# Patient Record
Sex: Male | Born: 1965 | Race: White | Hispanic: No | Marital: Married | Smoking: Never smoker
Health system: Southern US, Academic
[De-identification: ages and names within clinical notes are randomized; demographics above are authoritative.]

## PROBLEM LIST (undated history)

## (undated) DIAGNOSIS — J309 Allergic rhinitis, unspecified: Secondary | ICD-10-CM

## (undated) DIAGNOSIS — G43909 Migraine, unspecified, not intractable, without status migrainosus: Secondary | ICD-10-CM

## (undated) DIAGNOSIS — K219 Gastro-esophageal reflux disease without esophagitis: Secondary | ICD-10-CM

## (undated) DIAGNOSIS — E538 Deficiency of other specified B group vitamins: Secondary | ICD-10-CM

## (undated) DIAGNOSIS — K589 Irritable bowel syndrome without diarrhea: Secondary | ICD-10-CM

## (undated) DIAGNOSIS — E7800 Pure hypercholesterolemia, unspecified: Secondary | ICD-10-CM

## (undated) DIAGNOSIS — F419 Anxiety disorder, unspecified: Secondary | ICD-10-CM

## (undated) DIAGNOSIS — E559 Vitamin D deficiency, unspecified: Secondary | ICD-10-CM

## (undated) DIAGNOSIS — R739 Hyperglycemia, unspecified: Secondary | ICD-10-CM

## (undated) DIAGNOSIS — I10 Essential (primary) hypertension: Secondary | ICD-10-CM

## (undated) DIAGNOSIS — M1990 Unspecified osteoarthritis, unspecified site: Secondary | ICD-10-CM

## (undated) DIAGNOSIS — M109 Gout, unspecified: Secondary | ICD-10-CM

## (undated) DIAGNOSIS — M5431 Sciatica, right side: Secondary | ICD-10-CM

## (undated) DIAGNOSIS — G4700 Insomnia, unspecified: Secondary | ICD-10-CM

## (undated) DIAGNOSIS — R972 Elevated prostate specific antigen [PSA]: Secondary | ICD-10-CM

## (undated) HISTORY — DX: Gout, unspecified: M10.9

## (undated) HISTORY — DX: Sciatica, right side: M54.31

## (undated) HISTORY — DX: Deficiency of other specified B group vitamins: E53.8

## (undated) HISTORY — PX: COLONOSCOPY: WVUENDOPRO10

## (undated) HISTORY — DX: Insomnia, unspecified: G47.00

## (undated) HISTORY — DX: Elevated prostate specific antigen (PSA): R97.20

## (undated) HISTORY — DX: Hyperglycemia, unspecified: R73.9

## (undated) HISTORY — DX: Migraine, unspecified, not intractable, without status migrainosus: G43.909

## (undated) HISTORY — DX: Irritable bowel syndrome, unspecified: K58.9

## (undated) HISTORY — PX: SPINE SURGERY: SHX786

## (undated) HISTORY — DX: Vitamin D deficiency, unspecified: E55.9

---

## 1996-07-21 ENCOUNTER — Other Ambulatory Visit (HOSPITAL_COMMUNITY): Payer: Self-pay

## 2009-01-09 IMAGING — CR CXR
1 series · 2 of 2 positions shown · non-contrast
Comparison: 

Deberry, Osman

EXAM:
PA AND LATERAL CHEST 2V
HISTORY: Cough, sinus pressure, headache.

[Series 1: view not recorded · 0.17mm/px · 2 of 2 slices shown]
[im 1/2]
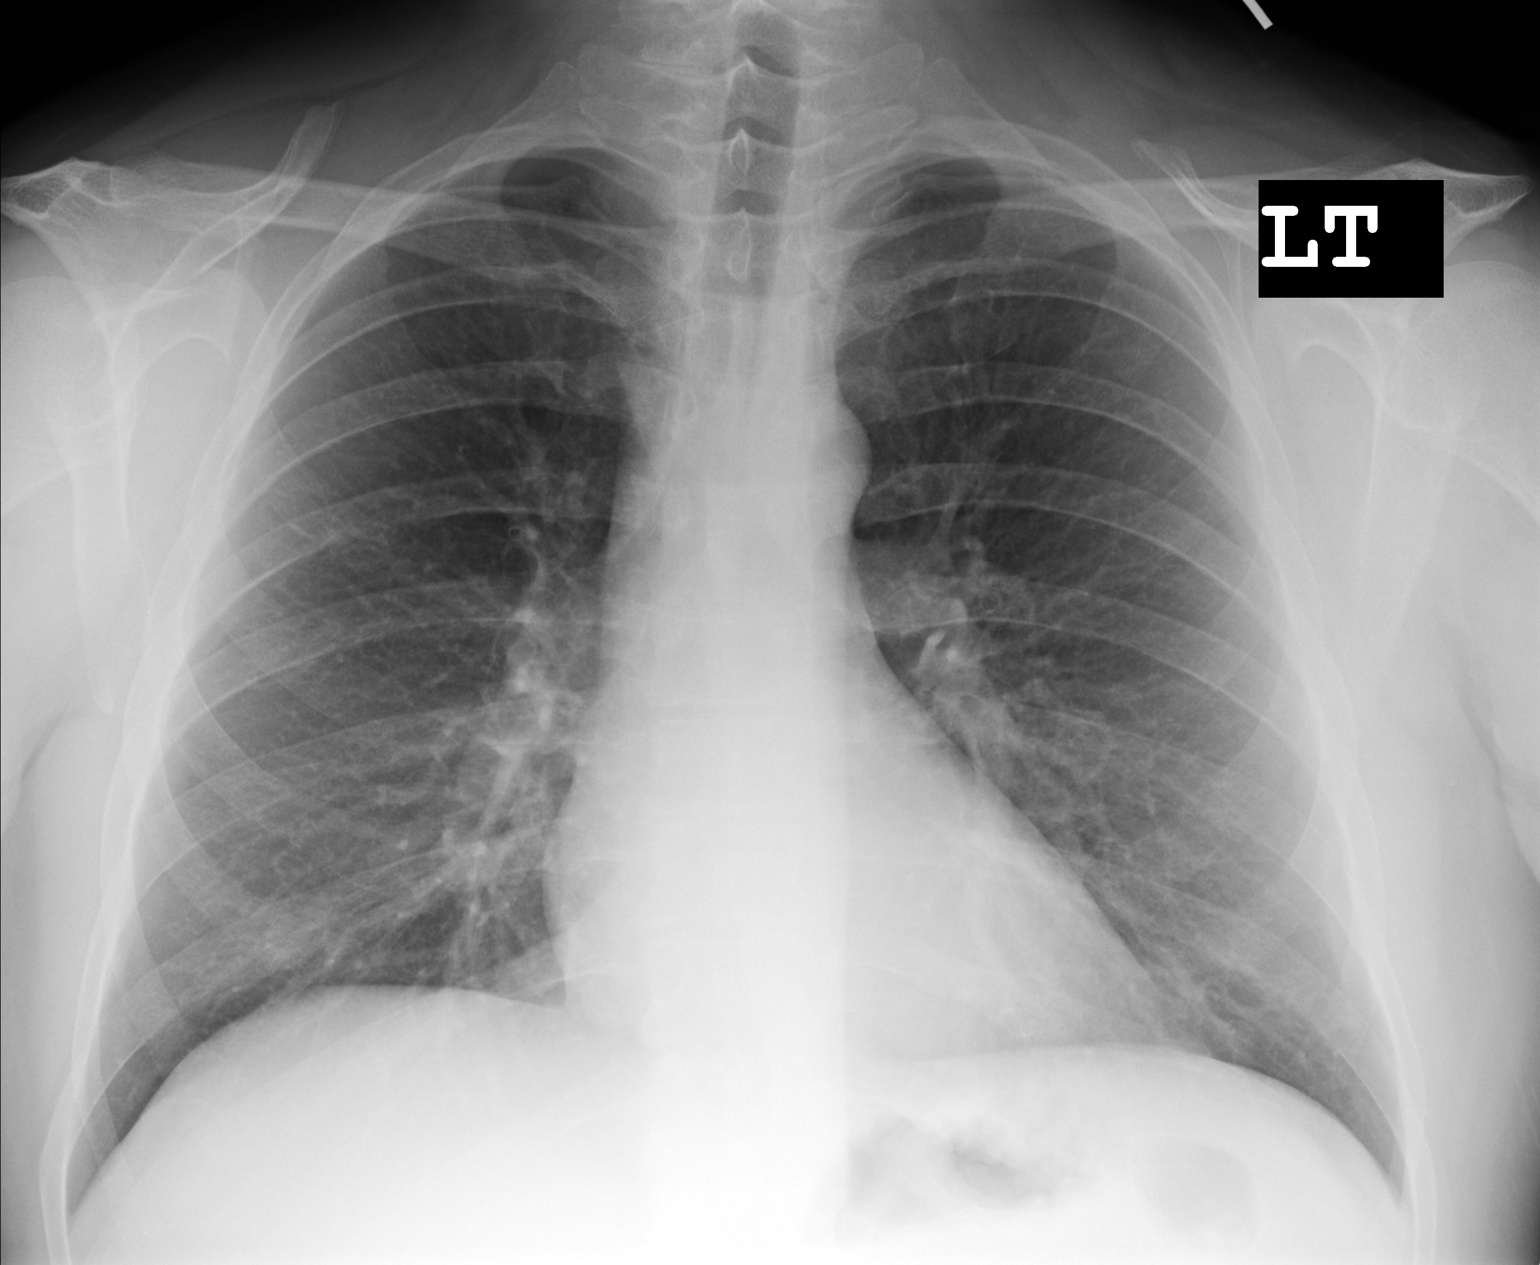
[im 2/2]
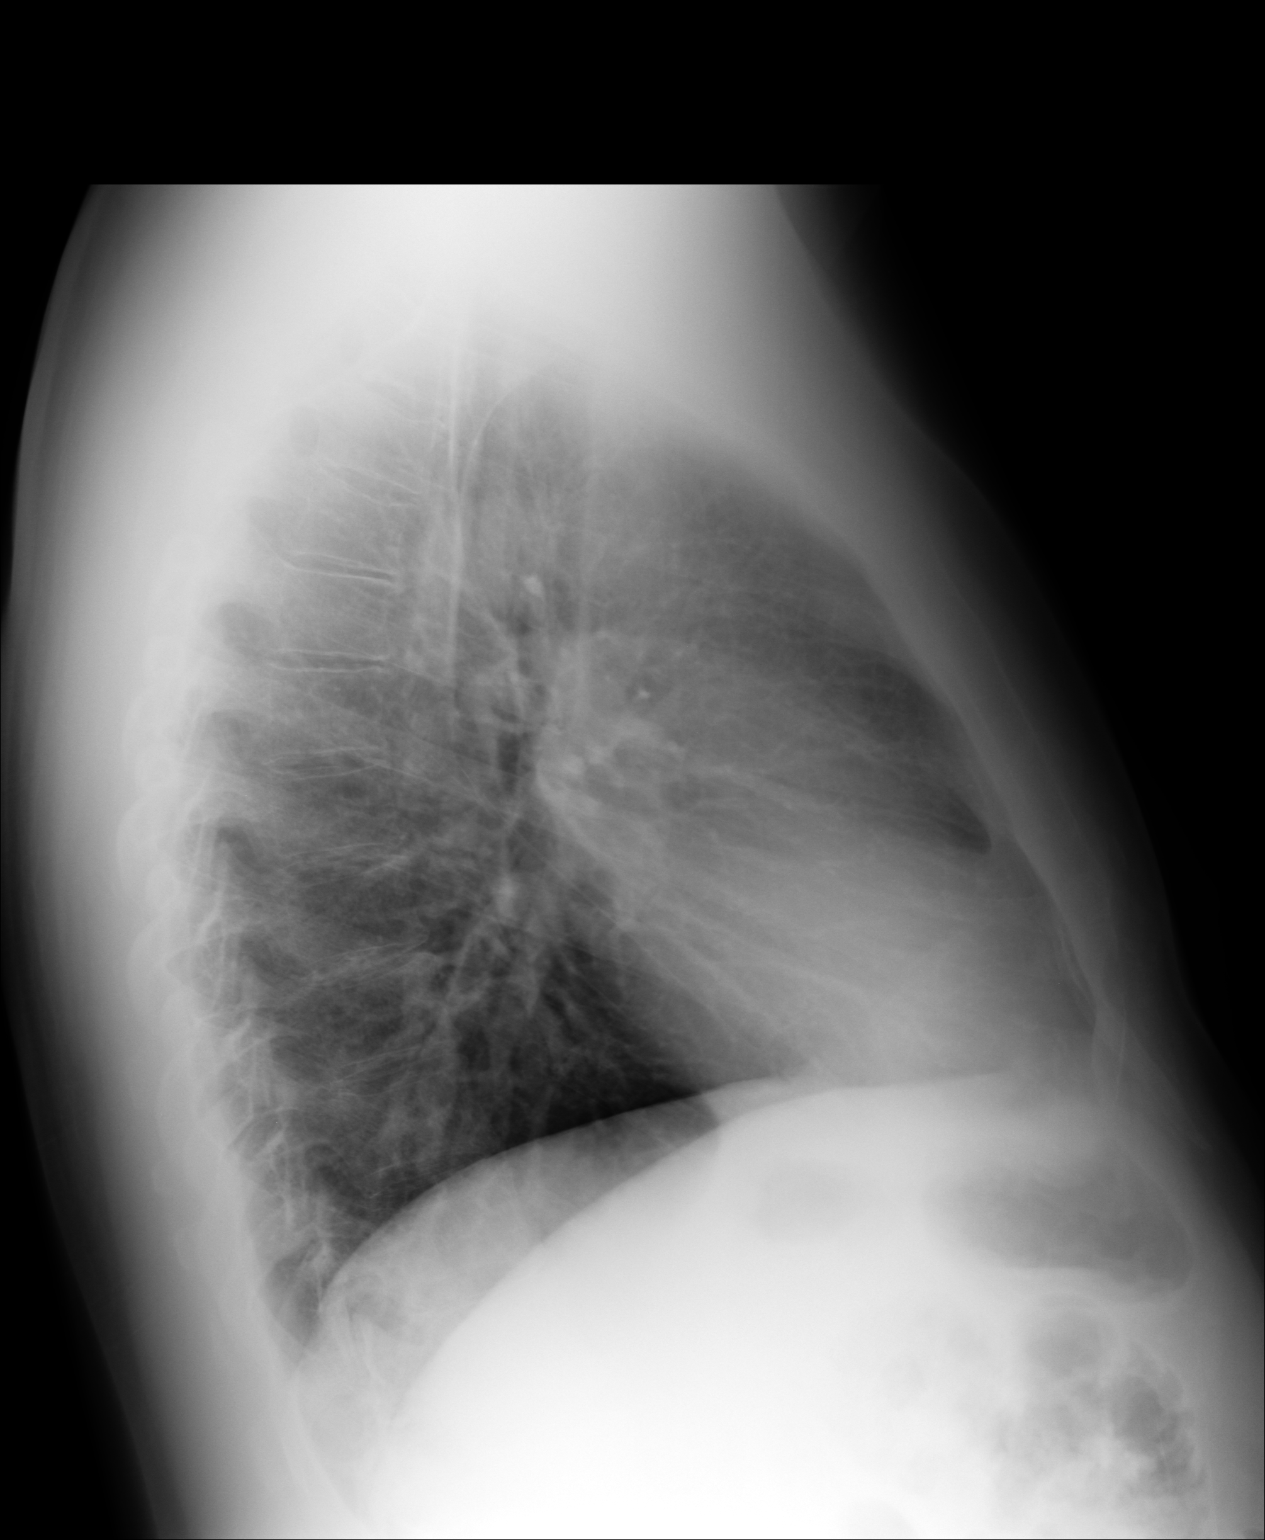

[2 of 2 positions shown; findings below may reference images not displayed]

FINDINGS: The cardiothoracic ratio is within normal limits.  Lung fields are clear.  Hila and mediastinum are normal.
IMPRESSION: No acute cardiopulmonary abnormalities are seen, and no interval changes are seen from previous chest x-rays.  

________________________________

## 2009-01-09 IMAGING — CR SINUSES
1 series · 3 of 3 positions shown · non-contrast
Comparison: none

Ludeen, Nangaily

EXAM:
SINUS SERIES 3V
HISTORY: Cough, sinus pressure, headache.

[Series 1: view not recorded · 0.17mm/px · 3 of 3 slices shown]
[im 1/3]
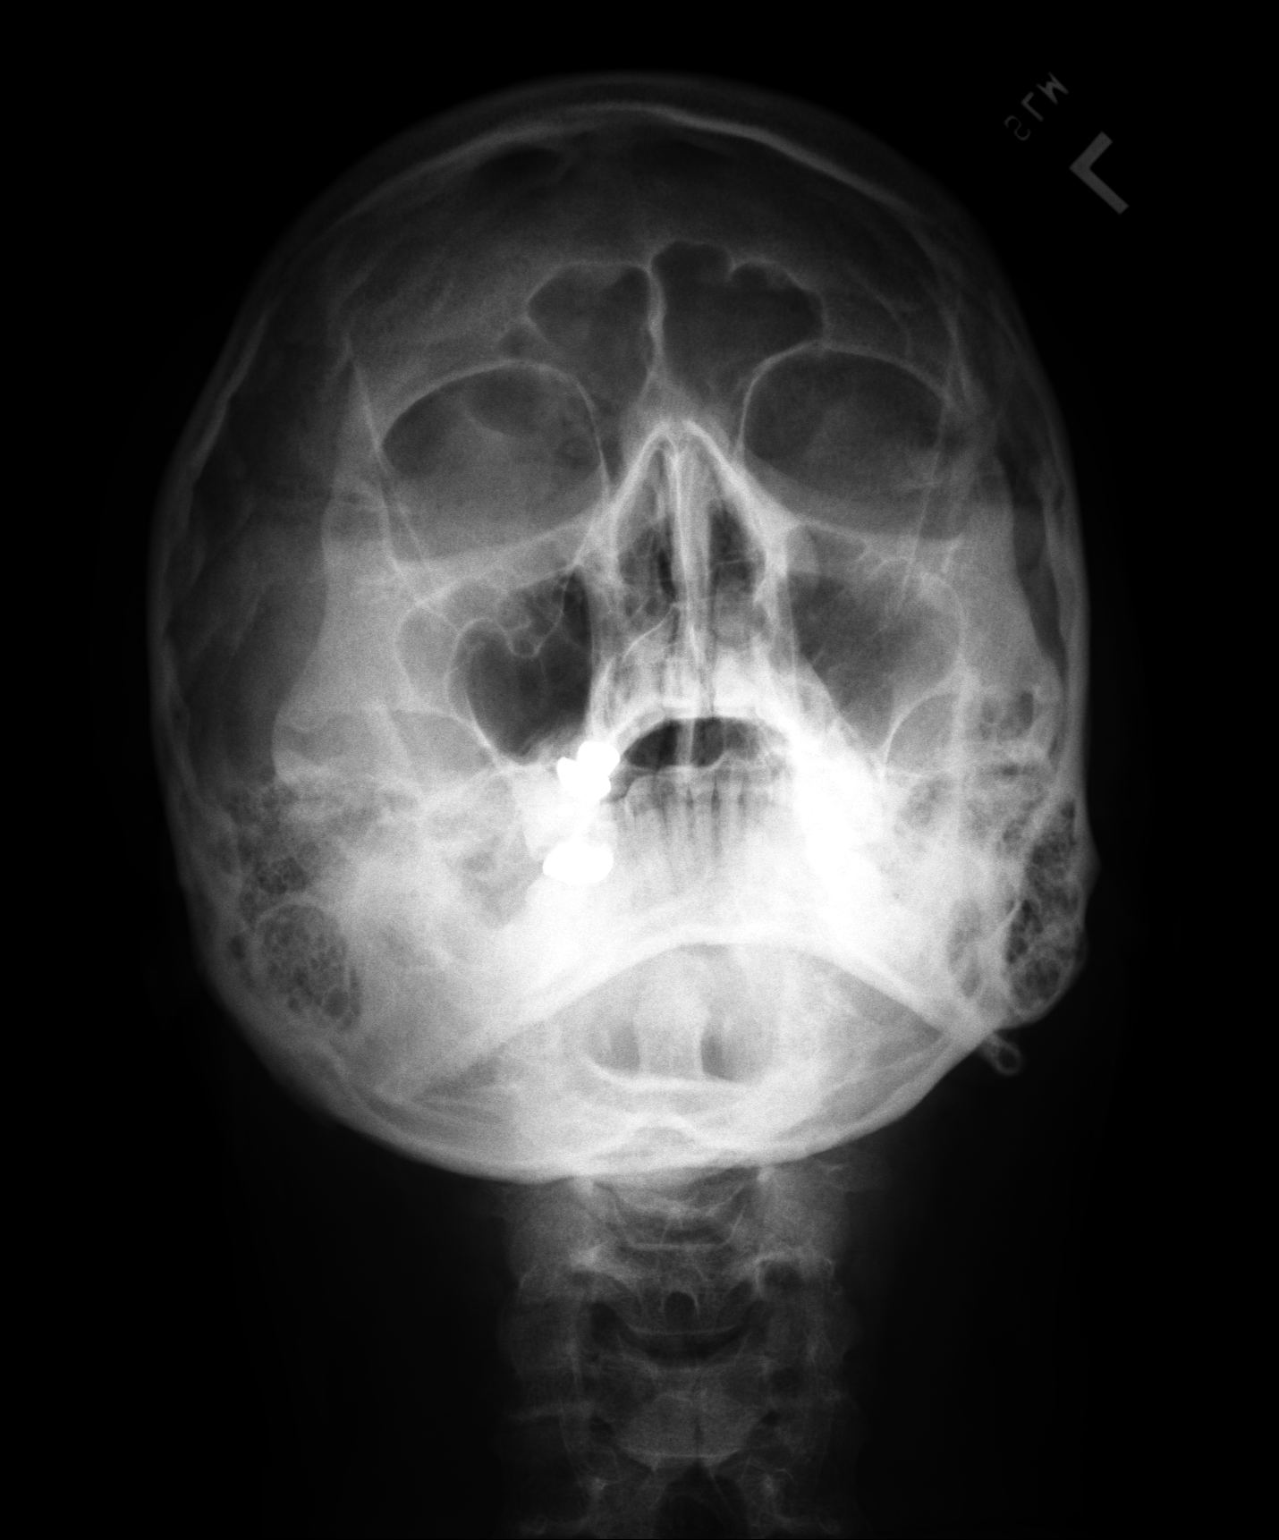
[im 2/3]
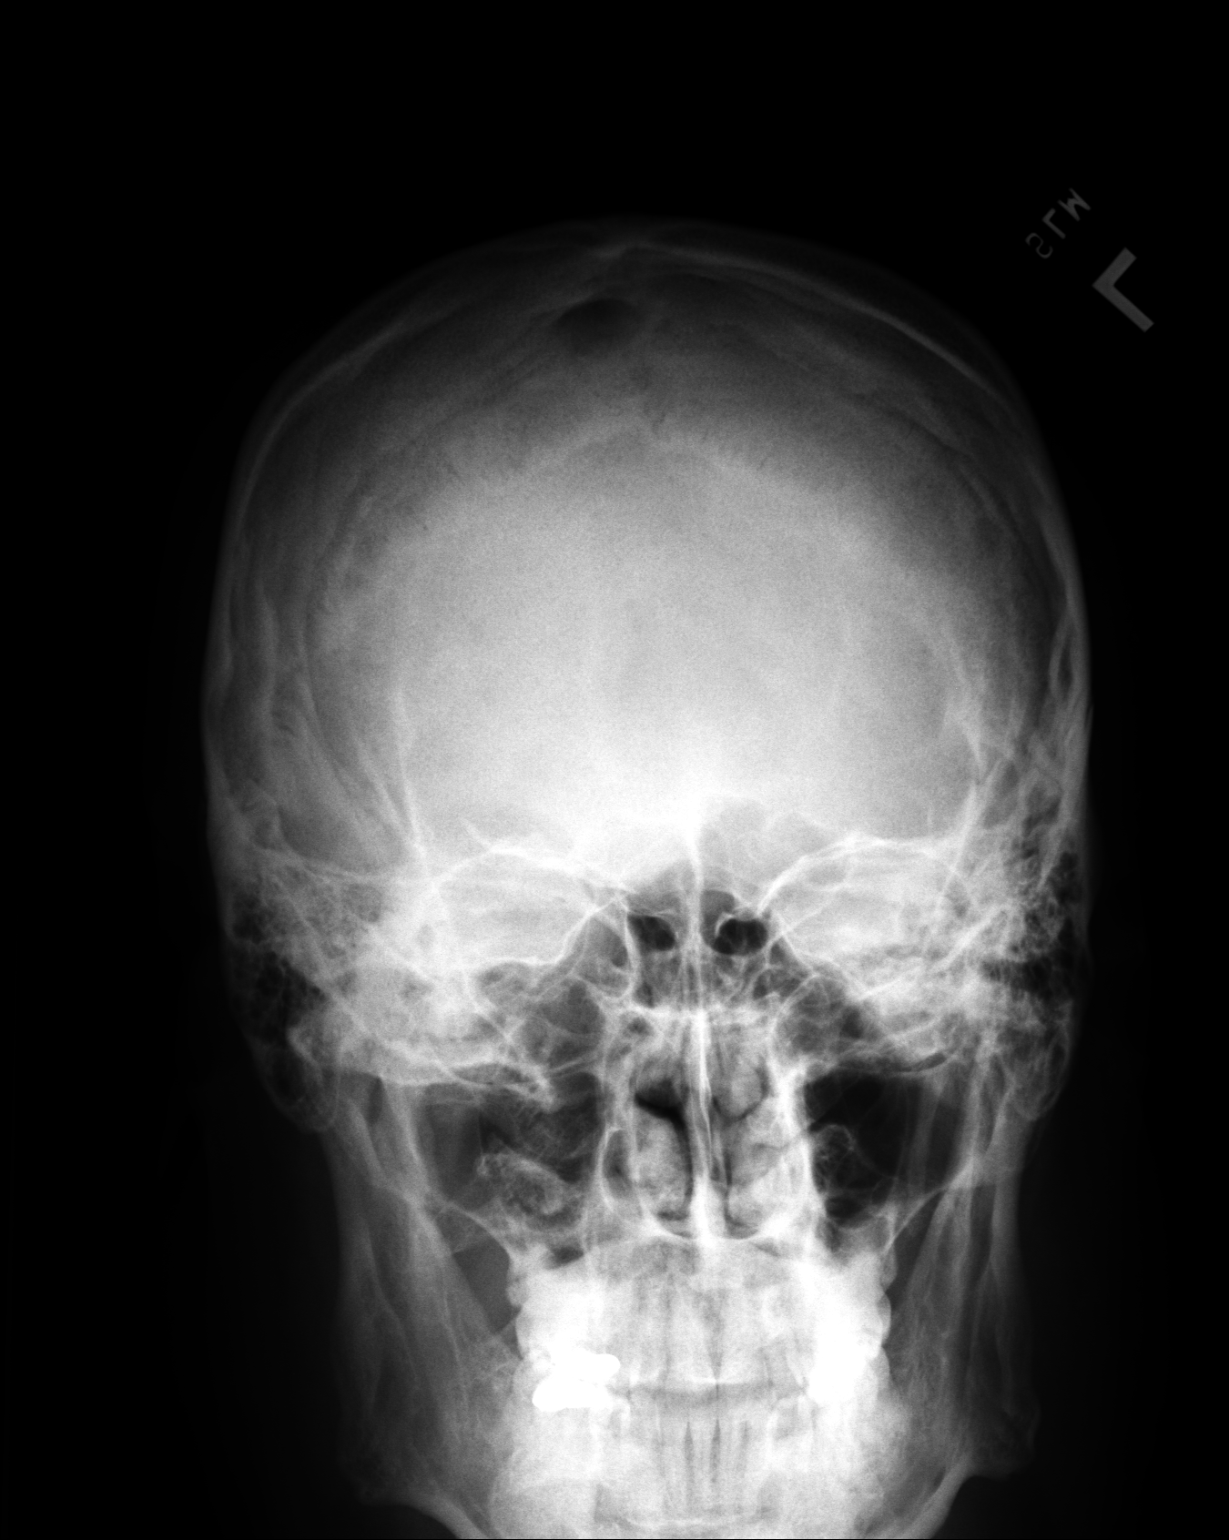
[im 3/3]
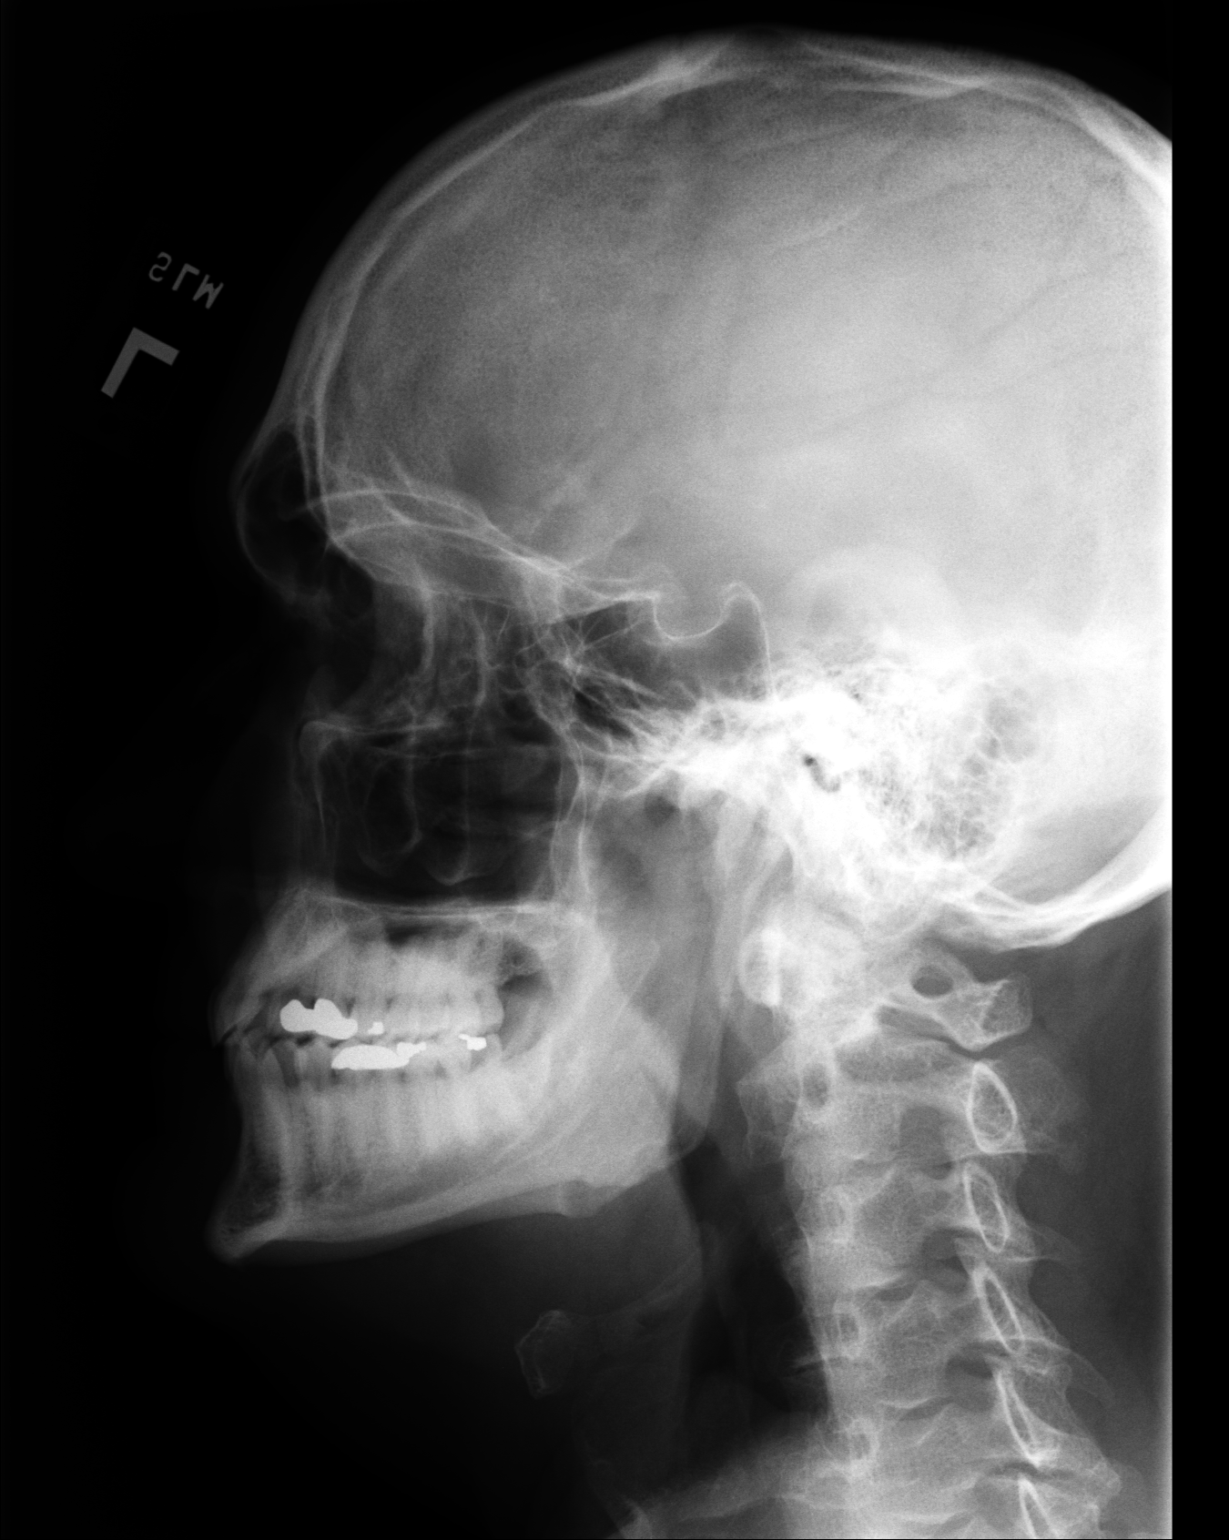

[3 of 3 positions shown; findings below may reference images not displayed]

FINDINGS: Sinuses are clear.  No inflammatory changes or fluid collections are seen.  No focal bone changes are seen.
IMPRESSION: No acute abnormalities of the sinuses are seen.    

________________________________

## 2010-05-23 IMAGING — CR CHEST
1 series · 3 of 3 positions shown · non-contrast
Comparison: 

Adu, Amrita

Exam:
PA and Lateral Chest 2V
HISTORY: Hypertension, positive tuberculin skin test.

[Series 1: view not recorded · 0.17mm/px · 3 of 3 slices shown]
[im 1/3]
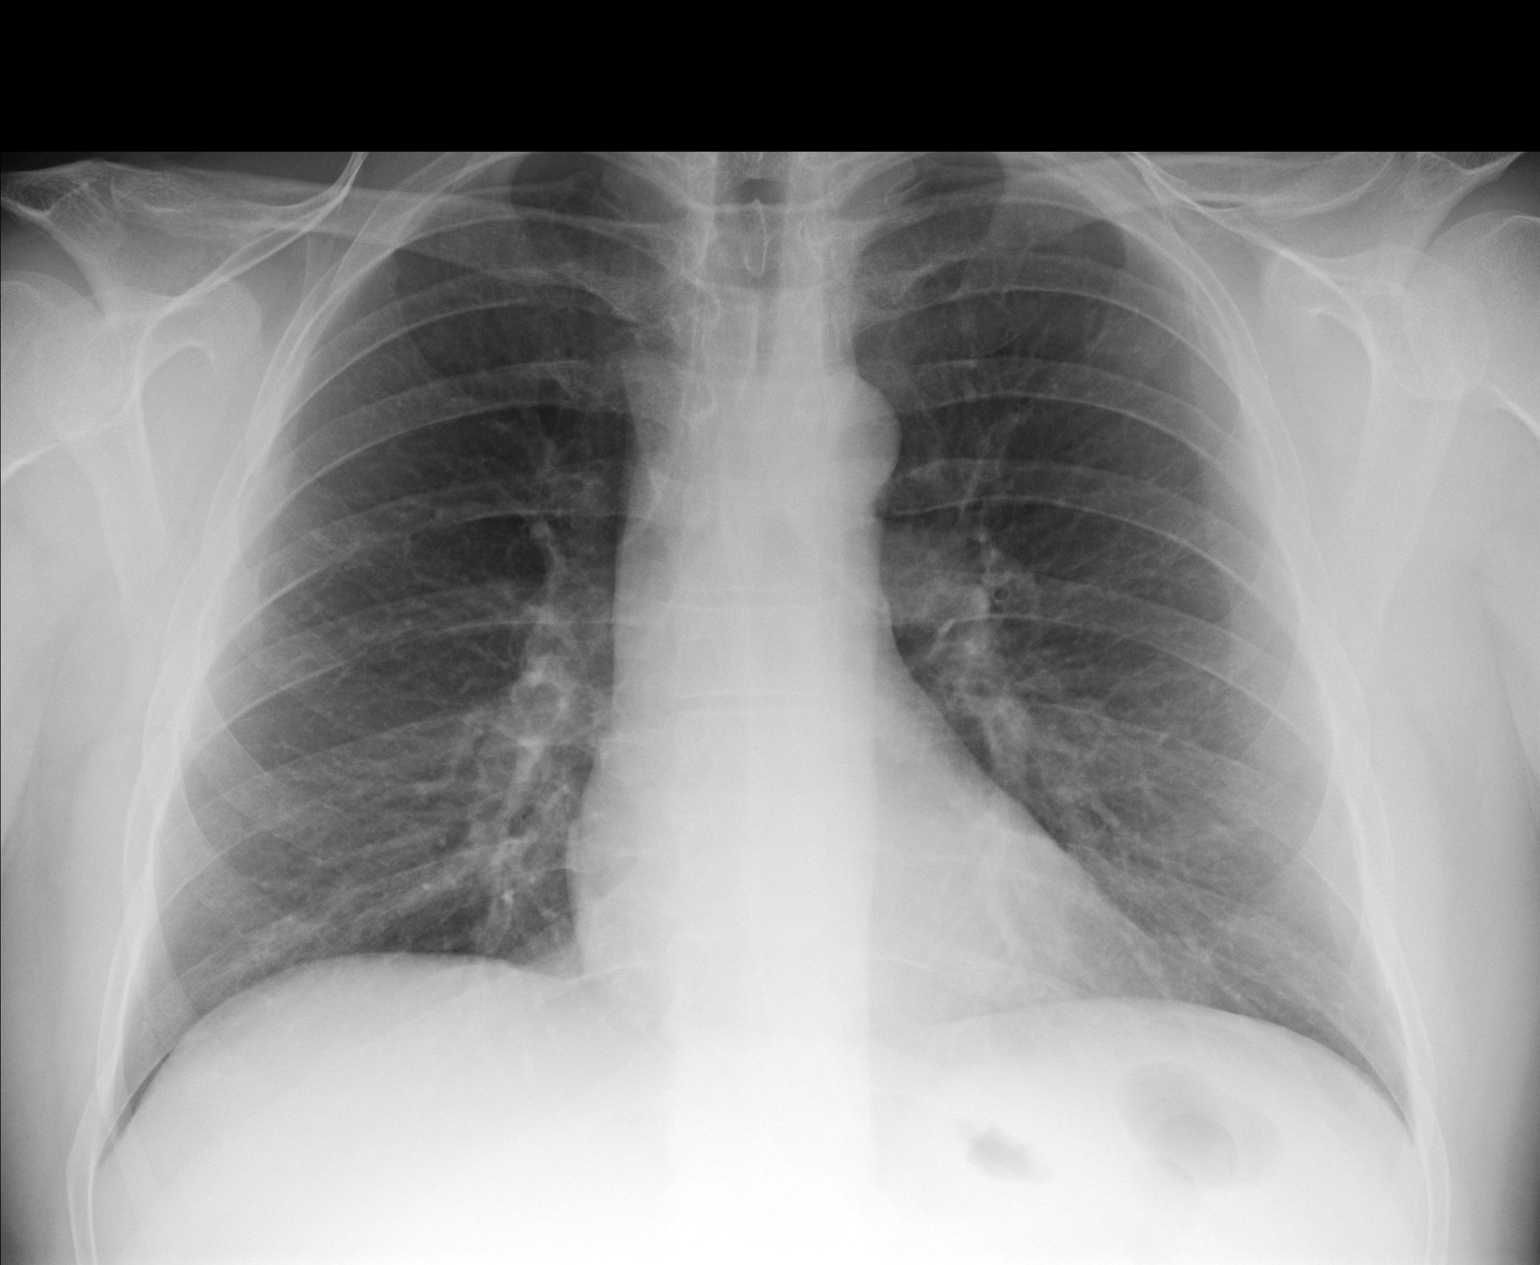
[im 2/3]
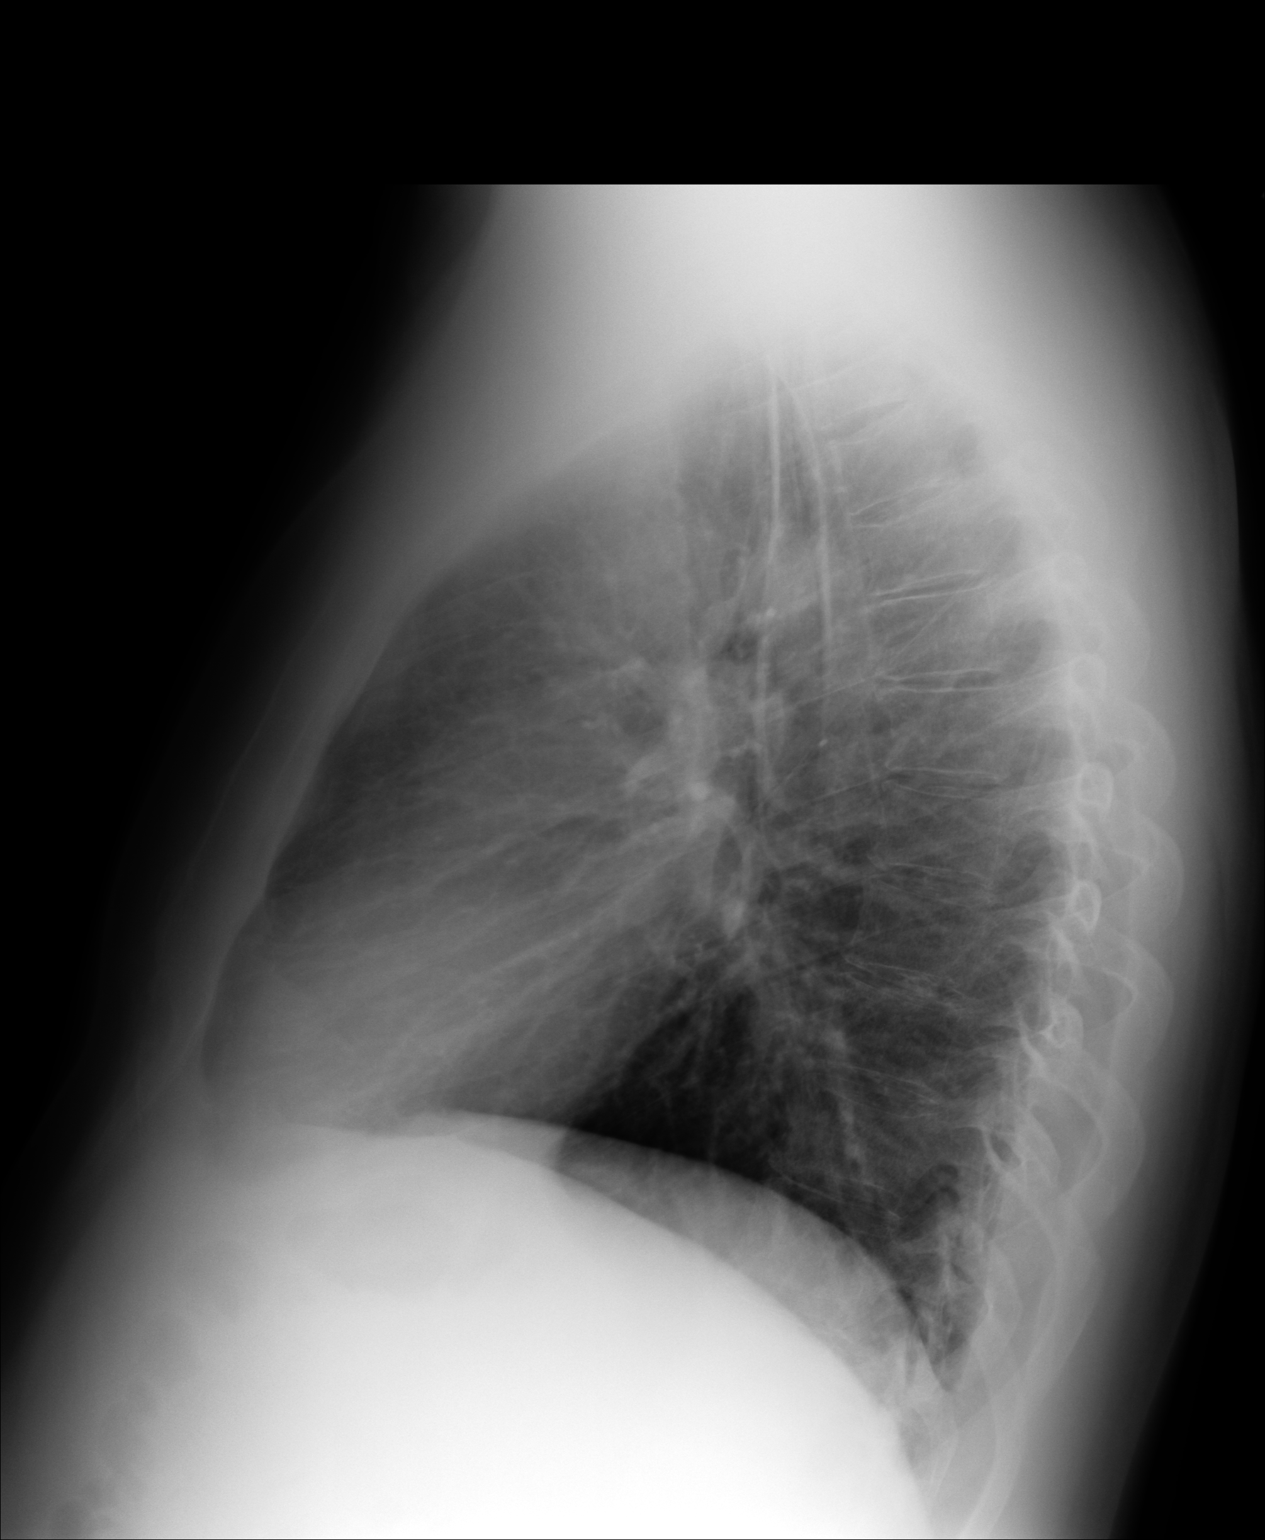
[im 3/3]
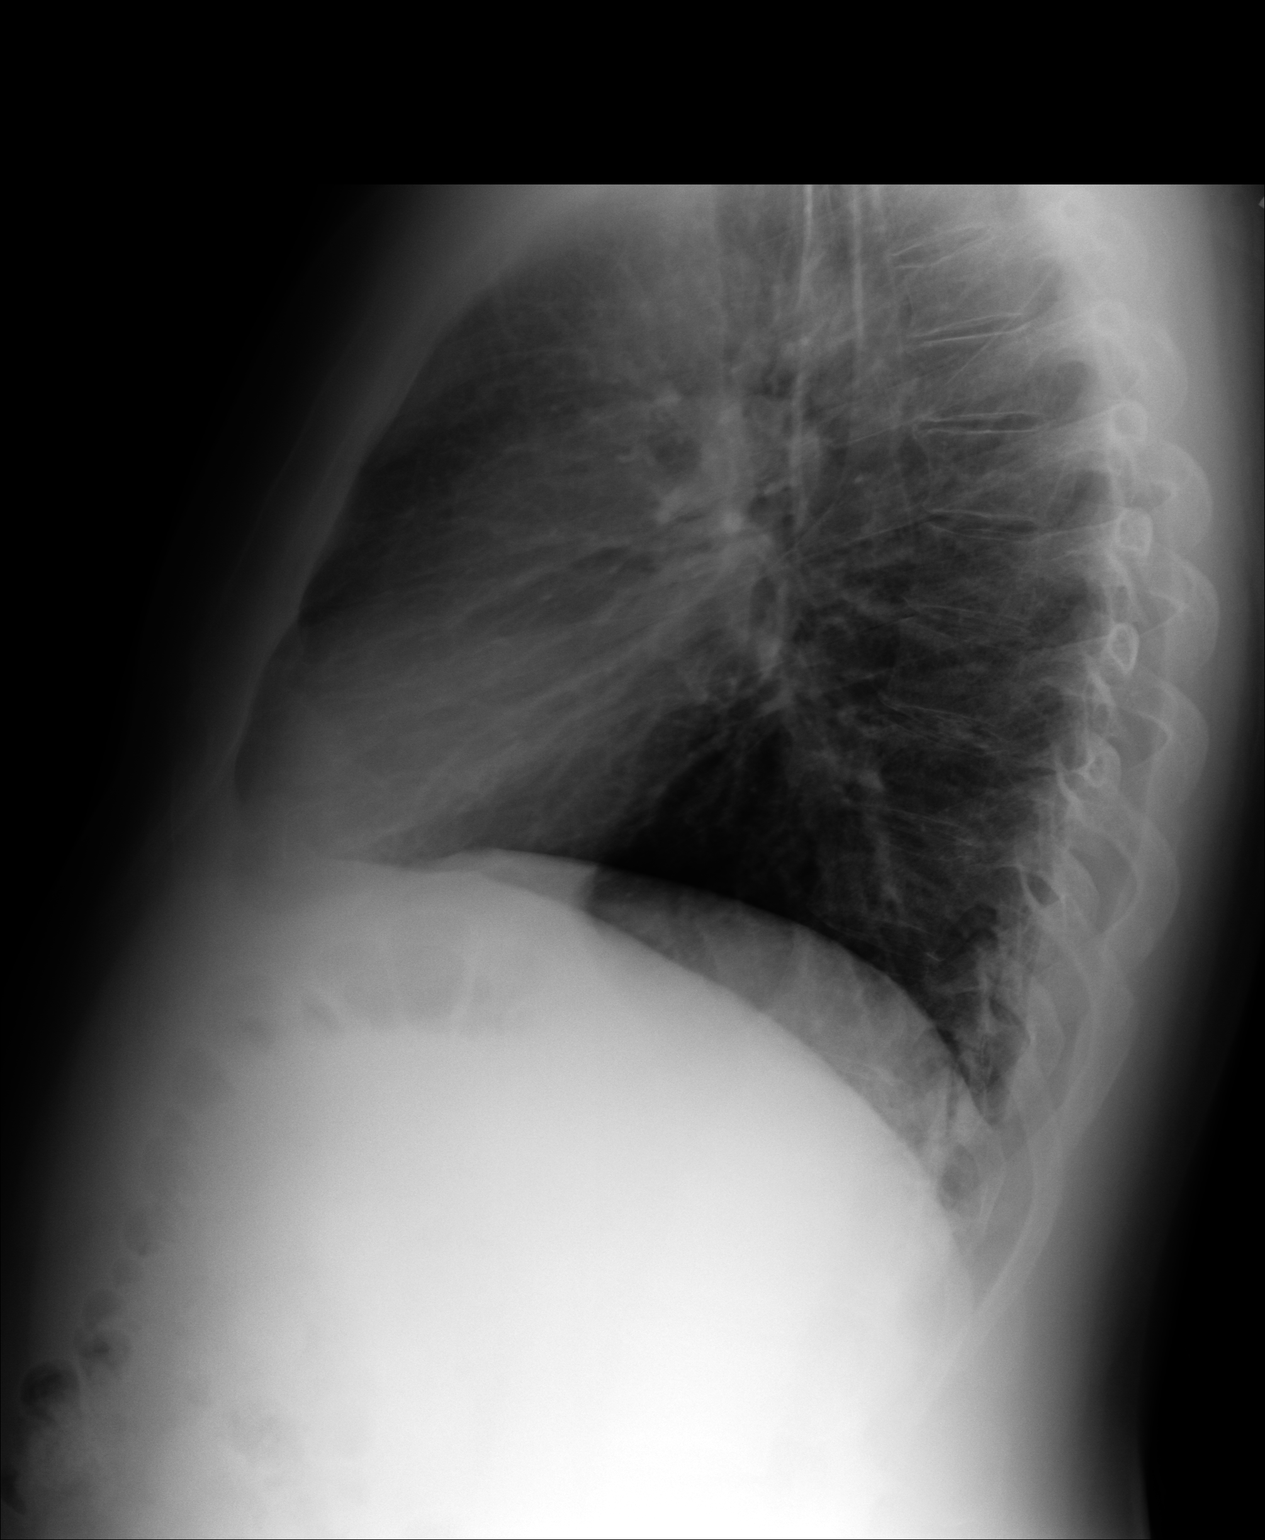

[3 of 3 positions shown; findings below may reference images not displayed]

FINDINGS: Cardiothoracic ratio is normal. Lungs are clear. No granulomas or active pulmonary lesions are seen. No hilar or mediastinal enlargement or effusion is seen.
IMPRESSION: No acute cardiopulmonary lesions or focal pulmonary nodules are seen. No interval changes are seen from 07/18/06. 

________________________________

[DATE] [DATE]

## 2020-06-14 IMAGING — MR MRI LUMBAR SPINE WITHOUT CONTRAST
5 of 6 series · 32 of 48 positions shown · IV contrast (gadolinium)
Comparison: None available.

﻿EXAM:  [id number]   MRI LUMBAR SPINE WITHOUT CONTRAST
INDICATION: Lower back pain with right lower extremity radiculopathy for several weeks.
TECHNIQUE: Multiplanar multisequential MRI of the lumbar spine was performed without gadolinium contrast.

[Series 5: T2 · sagittal · 4.0mm · 0.94mm/px · 6 of 13 slices shown (1 of 3)]
[im 1/13]
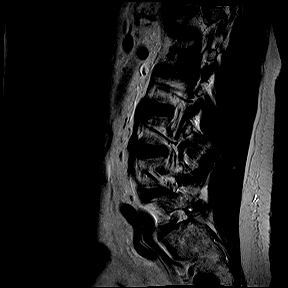
[im 3/13]
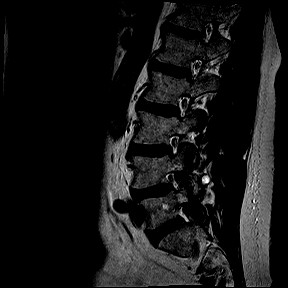
[im 5/13]
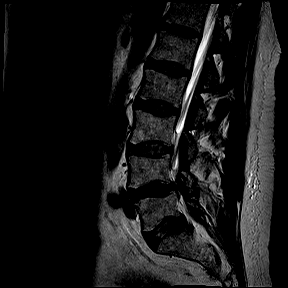
[im 8/13]
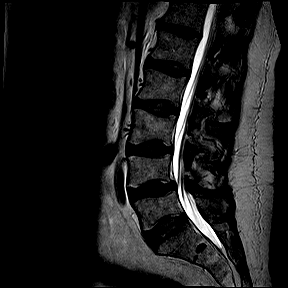
[im 10/13]
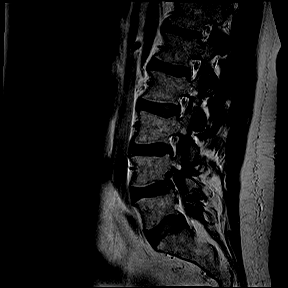
[im 13/13]
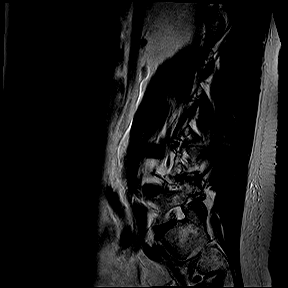

[Series 6: T1 · sagittal · 4.0mm · 0.94mm/px · 6 of 13 slices shown (1 of 2)]
[im 1/13]
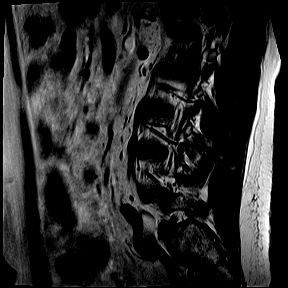
[im 3/13]
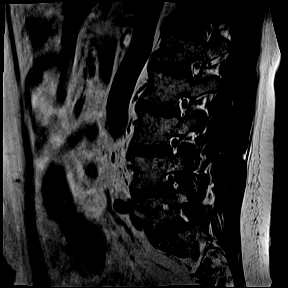
[im 5/13]
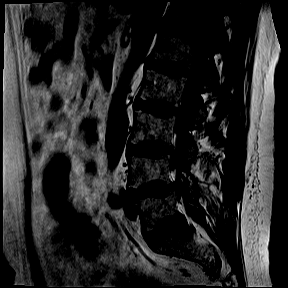
[im 8/13]
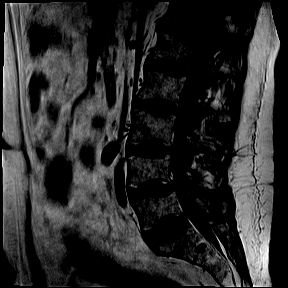
[im 10/13]
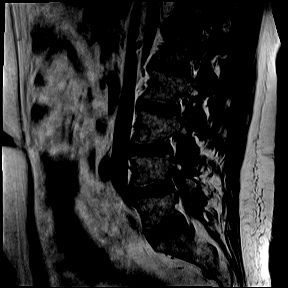
[im 13/13]
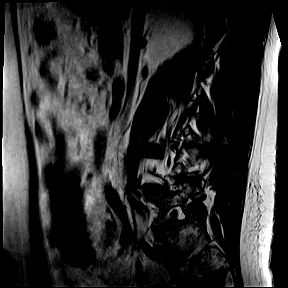

[Series 8: T2 · coronal · 5.0mm · 0.82mm/px · 8 of 18 slices shown (2 of 3)]
[im 1/18]
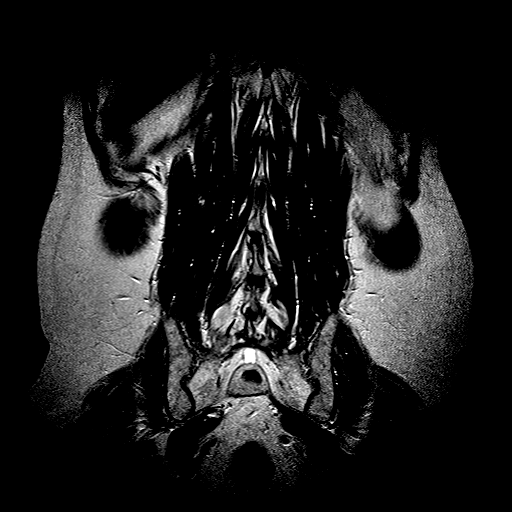
[im 3/18]
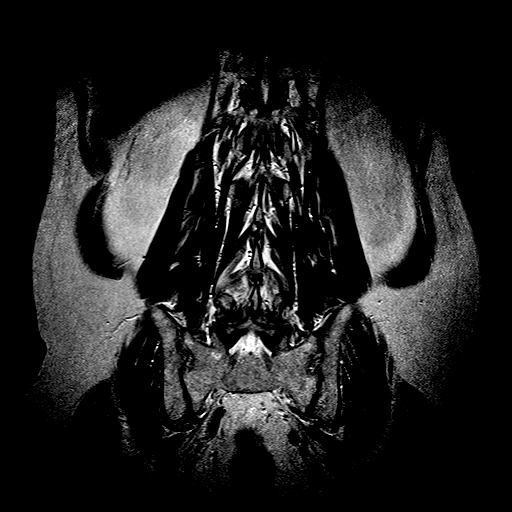
[im 5/18]
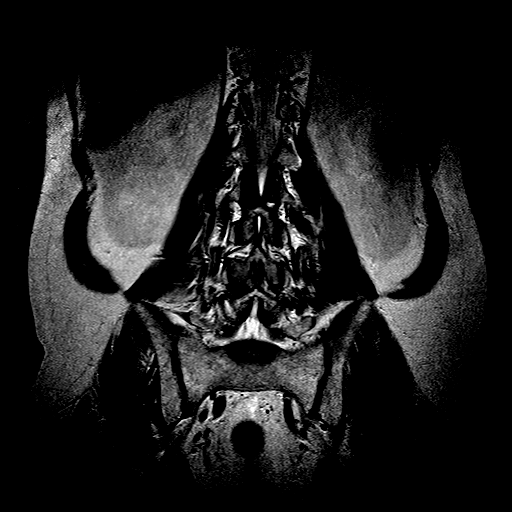
[im 8/18]
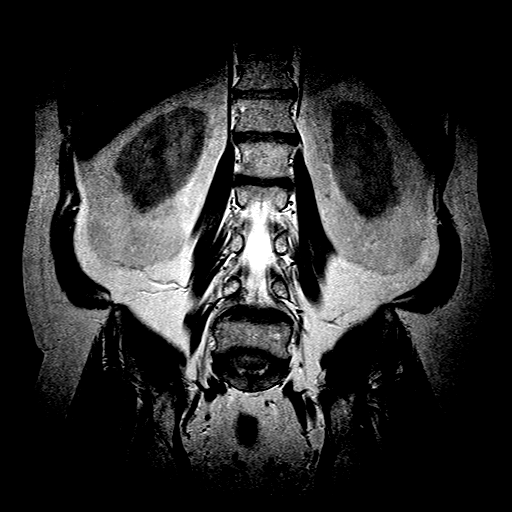
[im 10/18]
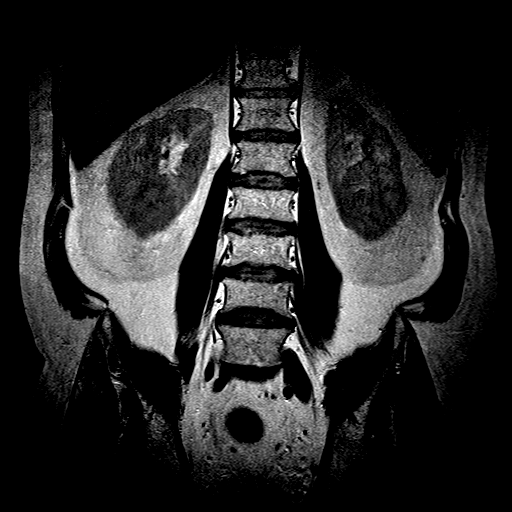
[im 13/18]
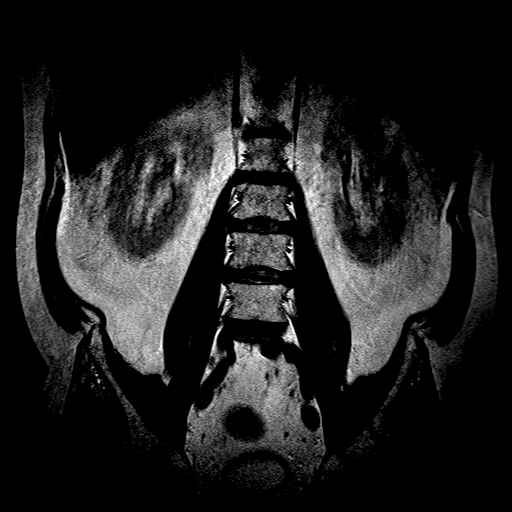
[im 15/18]
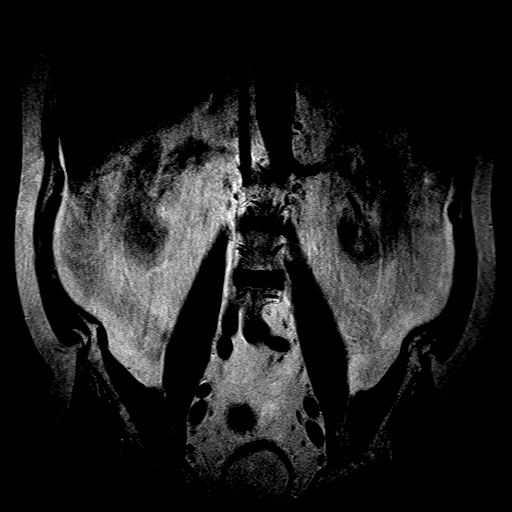
[im 18/18]
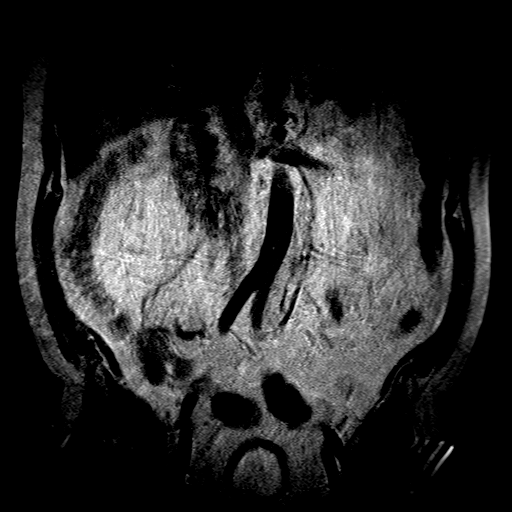

[Series 9: T2 · axial · 4.0mm · 0.52mm/px · z∈[-135,+97]mm · 11 of 23 slices shown (3 of 3)]
[im 1/23]
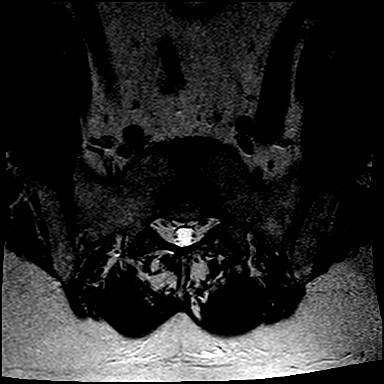
[im 3/23]
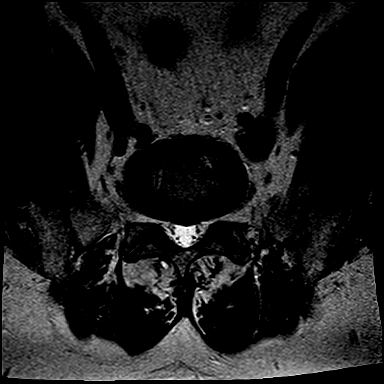
[im 5/23]
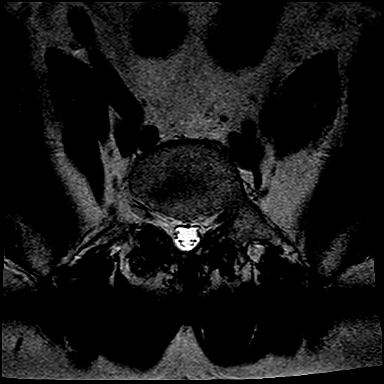
[im 7/23]
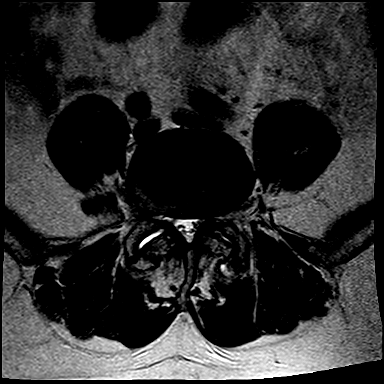
[im 9/23]
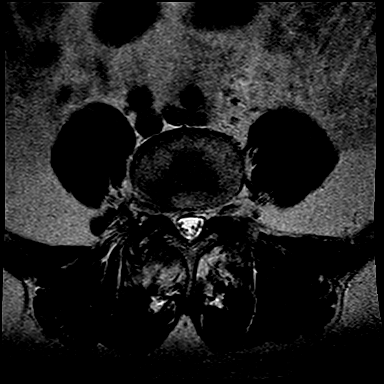
[im 12/23]
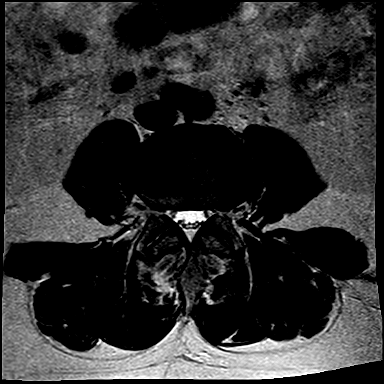
[im 14/23]
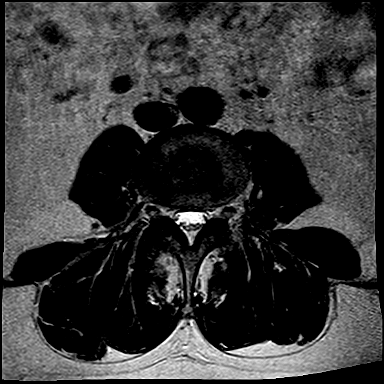
[im 16/23]
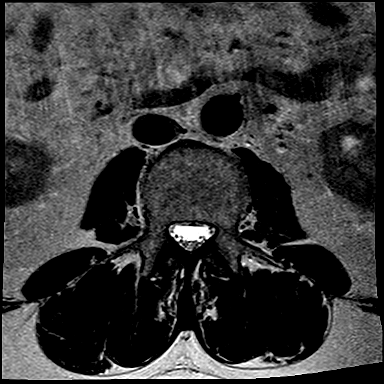
[im 18/23]
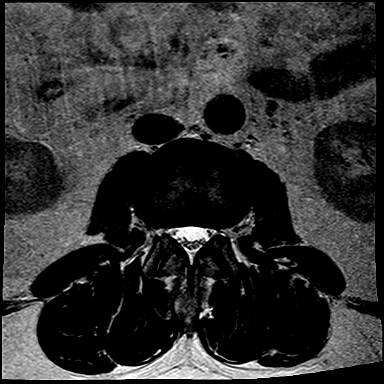
[im 20/23]
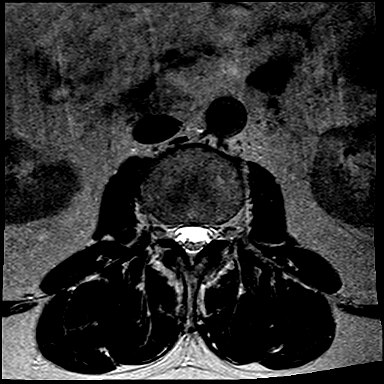
[im 23/23]
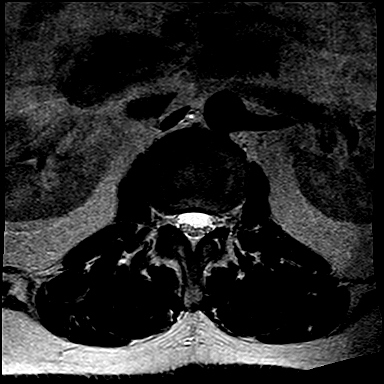

[Series 10: T1 · axial · 4.0mm · 0.52mm/px · 1 of 23 slices shown (2 of 2)]
[im 1/23]
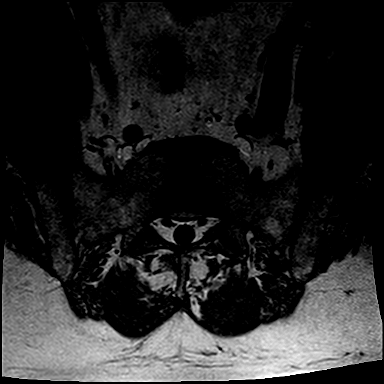

[32 of 48 positions shown; findings below may reference images not displayed]

FINDINGS: Bone marrow signal intensity is normal. There is no acute fracture or subluxation. Distal spinal cord is not well visualized. Spinal canal is congenitally narrow.

L1-2 level is unremarkable.

There is mild bilateral neural foraminal stenosis at L2-3 and L3-4 levels from bulging annulus without nerve root impingement.

At L4-5 level, there is minimal anterolisthesis of L4 on L5 vertebral body. There is a minimal bulging annulus, minimally effacing the ventral thecal sac. There is a 14 x 4.5 mm right foraminal extruded disc fragment with associated facet arthropathy resulting in moderate to severe neural foraminal stenosis. Moderate left neural foraminal stenosis also noted from facet arthropathy and bulging annulus. 

L5-S1 level and paraspinal soft tissues are unremarkable.
IMPRESSION: 1. Minimal anterolisthesis of L4 on L5 vertebral body. 

2. No significant disc herniation or spinal stenosis at any level. 

3. A 14 x 4.5 mm right foraminal extruded disc fragment at L4-5 level with associated facet arthropathy resulting in moderate to severe neural foraminal stenosis.

## 2020-06-28 DIAGNOSIS — M5116 Intervertebral disc disorders with radiculopathy, lumbar region: Secondary | ICD-10-CM | POA: Insufficient documentation

## 2020-06-28 DIAGNOSIS — M5441 Lumbago with sciatica, right side: Secondary | ICD-10-CM | POA: Insufficient documentation

## 2020-06-28 DIAGNOSIS — M5136 Other intervertebral disc degeneration, lumbar region: Secondary | ICD-10-CM | POA: Insufficient documentation

## 2020-06-28 DIAGNOSIS — Z4789 Encounter for other orthopedic aftercare: Secondary | ICD-10-CM | POA: Insufficient documentation

## 2020-06-28 DIAGNOSIS — M4316 Spondylolisthesis, lumbar region: Secondary | ICD-10-CM | POA: Insufficient documentation

## 2020-08-17 DIAGNOSIS — M47816 Spondylosis without myelopathy or radiculopathy, lumbar region: Secondary | ICD-10-CM | POA: Insufficient documentation

## 2021-01-08 LAB — ENTER/EDIT EXTERNAL COMMON LAB RESULTS
CHOLESTEROL: 149
HDL-CHOLESTEROL: 54
HEMOGLOBIN A1C: 5.3
LDL (CALCULATED): 58
LDL CHOLESTEROL,DIRECT: 37
PROSTATE SPECIFIC AG: 1.07
TRIGLYCERIDES: 183

## 2021-06-15 ENCOUNTER — Other Ambulatory Visit (INDEPENDENT_AMBULATORY_CARE_PROVIDER_SITE_OTHER): Payer: Self-pay | Admitting: Internal Medicine

## 2021-06-15 DIAGNOSIS — J309 Allergic rhinitis, unspecified: Secondary | ICD-10-CM

## 2021-06-15 DIAGNOSIS — Z13220 Encounter for screening for lipoid disorders: Secondary | ICD-10-CM

## 2021-06-15 DIAGNOSIS — E785 Hyperlipidemia, unspecified: Secondary | ICD-10-CM

## 2021-06-15 DIAGNOSIS — I10 Essential (primary) hypertension: Secondary | ICD-10-CM

## 2021-06-15 DIAGNOSIS — K589 Irritable bowel syndrome without diarrhea: Secondary | ICD-10-CM

## 2021-06-15 DIAGNOSIS — M109 Gout, unspecified: Secondary | ICD-10-CM

## 2021-07-05 IMAGING — MR MRI KNEE LT W/O CONTRAST
4 series · 40 of 40 positions shown · IV contrast (gadolinium)
Comparison: None available.

﻿EXAM:  [id number]   MRI KNEE LT W/O CONTRAST
INDICATION: Pain.
TECHNIQUE: Multiplanar multisequential MRI of the left knee joint was performed without gadolinium contrast.

[Series 5: PD fat-sat · axial · left · 4.0mm · 0.59mm/px · z∈[-40,+90]mm · 11 of 30 slices shown (1 of 2)]
[im 1/30]
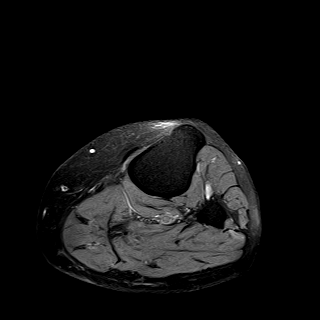
[im 3/30]
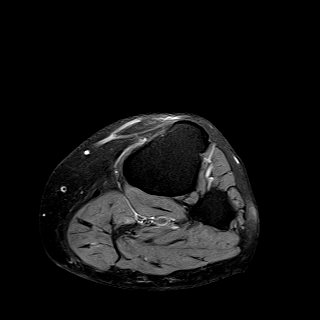
[im 6/30]
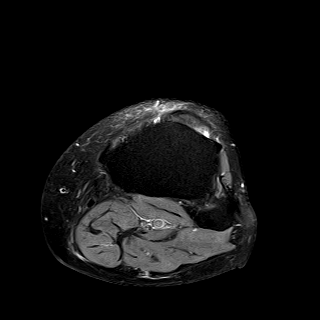
[im 9/30]
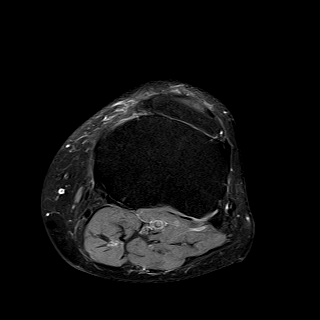
[im 12/30]
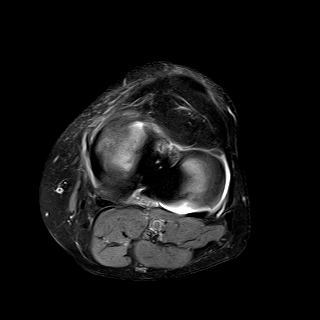
[im 15/30]
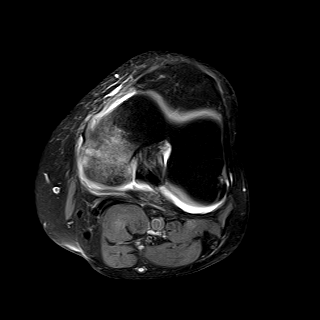
[im 18/30]
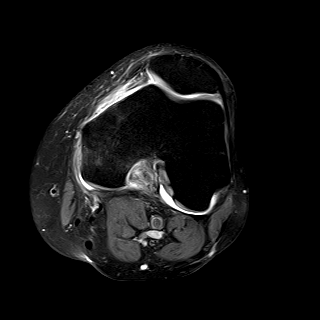
[im 21/30]
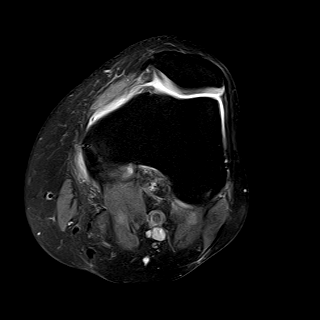
[im 24/30]
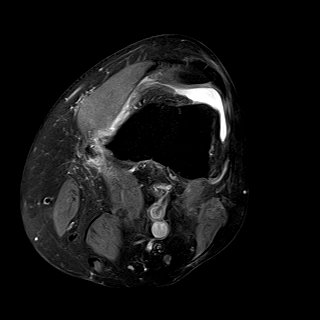
[im 27/30]
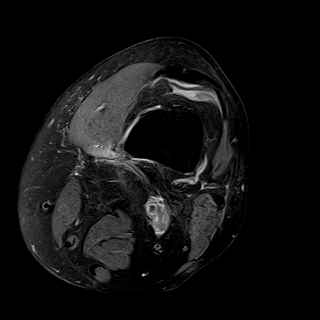
[im 30/30]
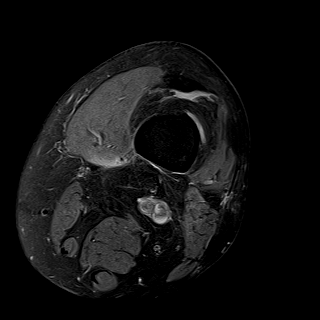

[Series 6: PD fat-sat · sagittal · left · 3.5mm · 0.53mm/px · 10 of 30 slices shown (2 of 2)]
[im 1/30]
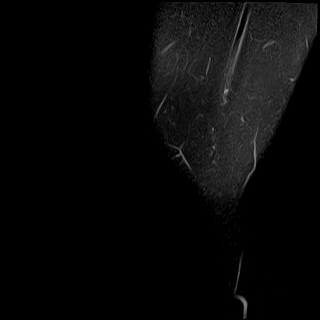
[im 4/30]
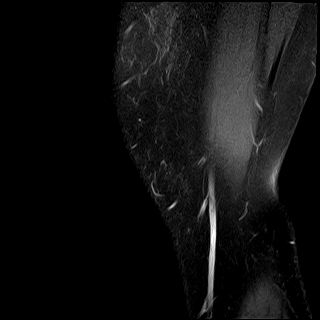
[im 7/30]
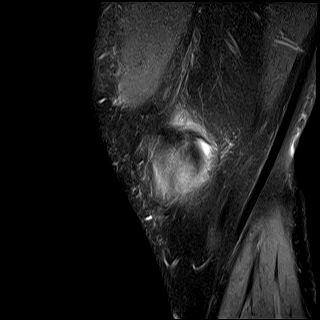
[im 10/30]
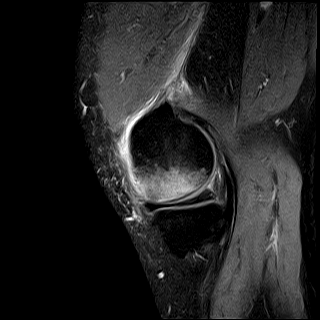
[im 13/30]
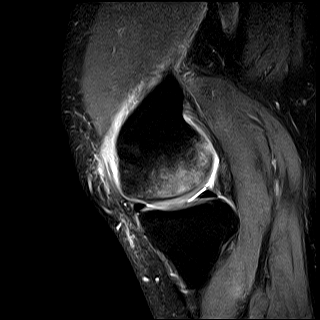
[im 17/30]
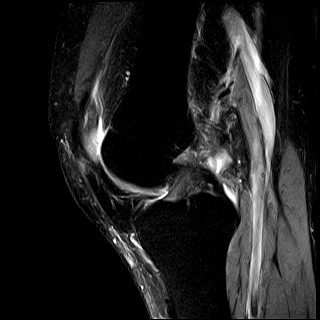
[im 20/30]
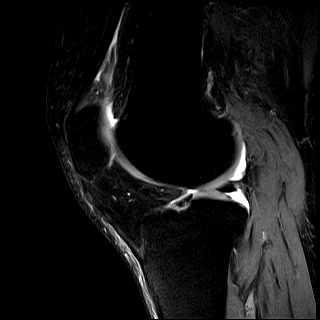
[im 23/30]
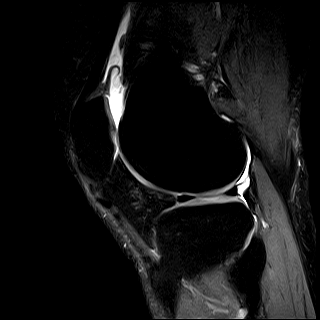
[im 26/30]
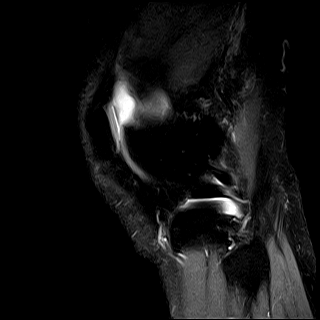
[im 30/30]
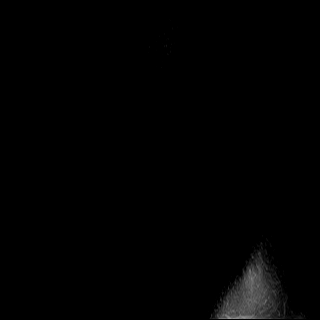

[Series 8: STIR · coronal · left · 3.5mm · 0.47mm/px · 9 of 27 slices shown]
[im 1/27]
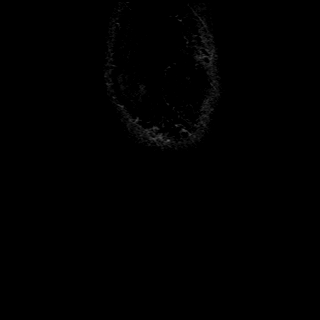
[im 4/27]
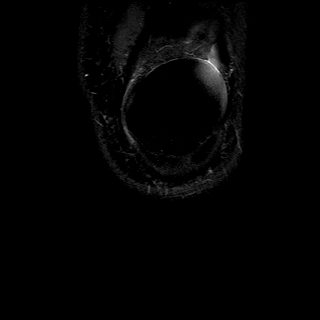
[im 7/27]
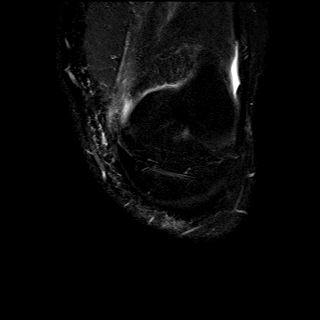
[im 10/27]
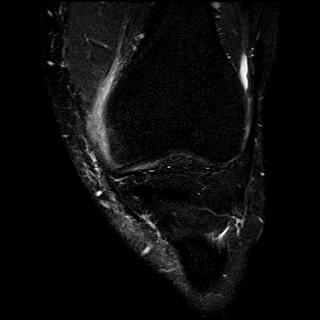
[im 14/27]
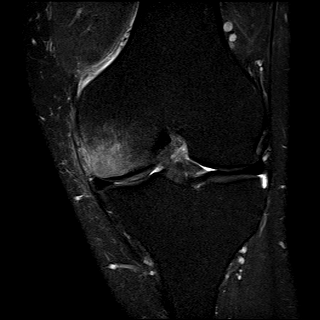
[im 17/27]
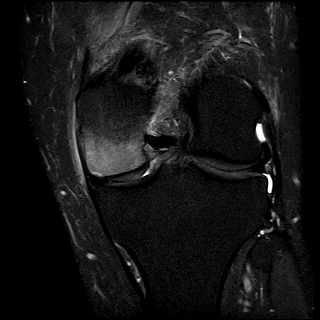
[im 20/27]
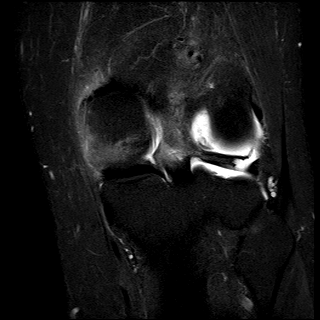
[im 23/27]
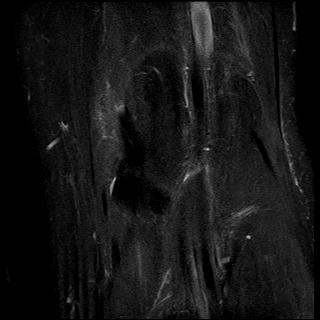
[im 27/27]
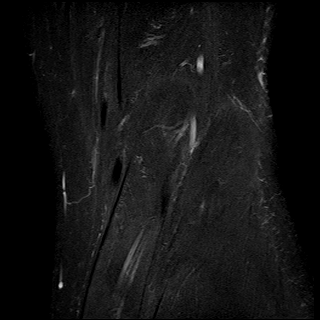

[Series 10: T1 · sagittal · left · 3.5mm · 0.44mm/px · 10 of 30 slices shown]
[im 1/30]
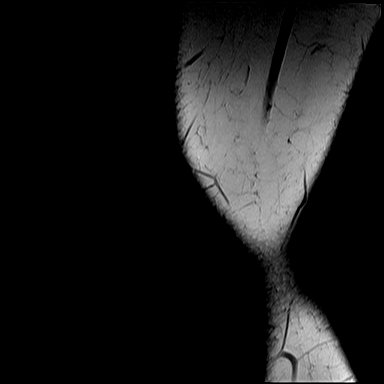
[im 4/30]
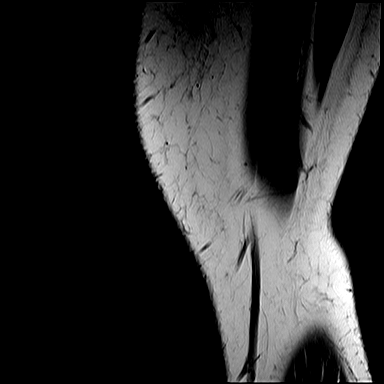
[im 7/30]
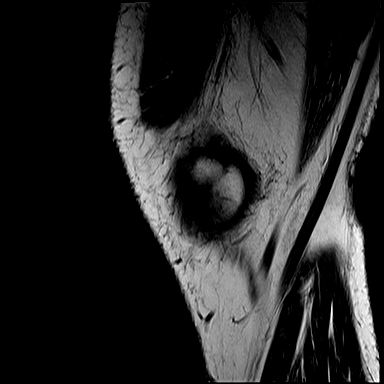
[im 10/30]
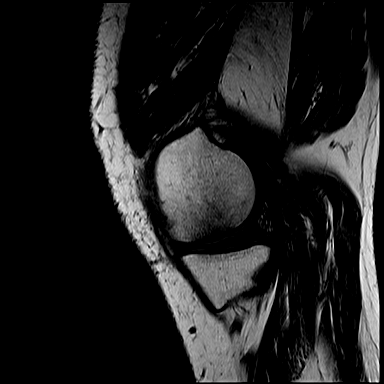
[im 13/30]
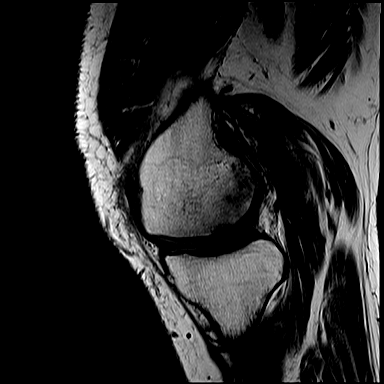
[im 17/30]
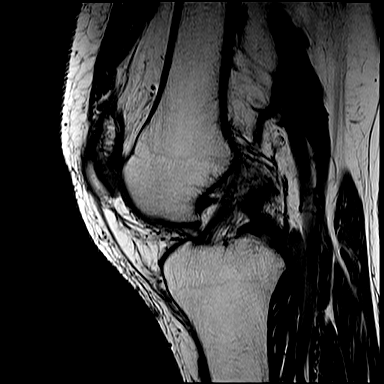
[im 20/30]
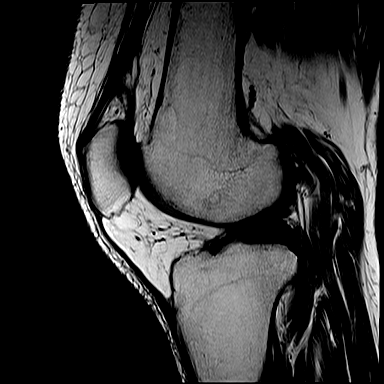
[im 23/30]
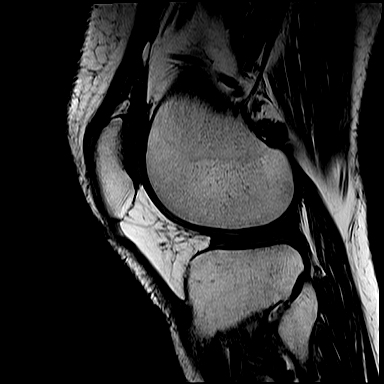
[im 26/30]
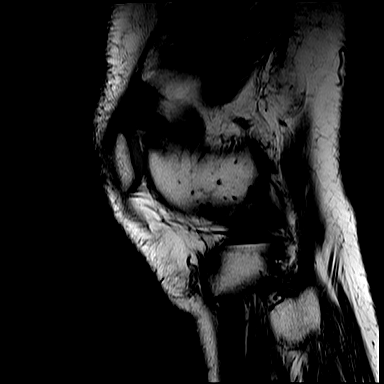
[im 30/30]
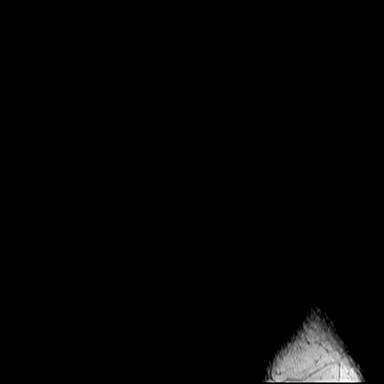

[40 of 40 positions shown; findings below may reference images not displayed]

FINDINGS: There is a subtle nondisplaced subcortical fracture involving the central weight-bearing portion of the medial femoral condyle. Minimally truncated appearance of the body of the lateral meniscus is concerning for a tear.  Medial meniscus, cruciate and collateral ligaments are intact, within normal limits in morphology and signal intensity.  Tibiofemoral and patellofemoral articular cartilage is well maintained. Extensor mechanism is unremarkable.  There is a small joint effusion.  There is no Baker's cyst.
IMPRESSION: 1. Subtle nondisplaced subcortical fracture involving the central weight-bearing portion of the medial femoral condyle. 

2. Suggestion of a subtle tear involving the body of the lateral meniscus.

## 2021-07-06 ENCOUNTER — Inpatient Hospital Stay (HOSPITAL_COMMUNITY)
Admission: RE | Admit: 2021-07-06 | Discharge: 2021-07-06 | Disposition: A | Discharge status: Home / Self Care | Payer: BC Managed Care – PPO | Source: Ambulatory Visit | Attending: Orthopaedic Surgery | Admitting: Orthopaedic Surgery

## 2021-07-06 ENCOUNTER — Other Ambulatory Visit: Payer: BC Managed Care – PPO | Attending: Orthopaedic Surgery

## 2021-07-06 ENCOUNTER — Other Ambulatory Visit: Payer: Self-pay

## 2021-07-06 ENCOUNTER — Other Ambulatory Visit (HOSPITAL_COMMUNITY): Payer: Self-pay | Admitting: Orthopaedic Surgery

## 2021-07-06 DIAGNOSIS — Z01818 Encounter for other preprocedural examination: Secondary | ICD-10-CM | POA: Insufficient documentation

## 2021-07-06 LAB — URINALYSIS, MACROSCOPIC
BILIRUBIN: NEGATIVE mg/dL
BLOOD: NEGATIVE mg/dL
GLUCOSE: NEGATIVE mg/dL
KETONES: NEGATIVE mg/dL
LEUKOCYTES: NEGATIVE WBCs/uL
NITRITE: NEGATIVE
PH: 7 (ref 5.0–9.0)
PROTEIN: NEGATIVE mg/dL
SPECIFIC GRAVITY: 1.02 (ref 1.002–1.030)
UROBILINOGEN: NORMAL mg/dL

## 2021-07-06 LAB — CBC WITH DIFF
BASOPHIL #: 0 10*3/uL (ref 0.00–0.30)
BASOPHIL %: 0 % (ref 0–3)
EOSINOPHIL #: 0.3 10*3/uL (ref 0.00–0.80)
EOSINOPHIL %: 3 % (ref 0–7)
HCT: 43.3 % (ref 42.0–51.0)
HGB: 15.3 g/dL (ref 13.5–18.0)
LYMPHOCYTE #: 1.4 10*3/uL (ref 1.10–5.00)
LYMPHOCYTE %: 13 % — ABNORMAL LOW (ref 25–45)
MCH: 31 pg (ref 27.0–32.0)
MCHC: 35.3 g/dL (ref 32.0–36.0)
MCV: 87.7 fL (ref 78.0–99.0)
MONOCYTE #: 1.1 10*3/uL (ref 0.00–1.30)
MONOCYTE %: 10 % (ref 0–12)
MPV: 8.1 fL (ref 7.4–10.4)
NEUTROPHIL #: 8 10*3/uL (ref 1.80–8.40)
NEUTROPHIL %: 74 % (ref 40–76)
PLATELETS: 225 10*3/uL (ref 140–440)
RBC: 4.93 10*6/uL (ref 4.20–6.00)
RDW: 12.7 % (ref 11.6–14.8)
WBC: 10.8 10*3/uL — ABNORMAL HIGH (ref 4.0–10.5)
WBCS UNCORRECTED: 10.8 10*3/uL

## 2021-07-06 LAB — BASIC METABOLIC PANEL
ANION GAP: 10 mmol/L (ref 10–20)
BUN/CREA RATIO: 22 (ref 6–22)
BUN: 20 mg/dL (ref 7–25)
CALCIUM: 9.9 mg/dL (ref 8.6–10.3)
CHLORIDE: 103 mmol/L (ref 98–107)
CO2 TOTAL: 26 mmol/L (ref 21–31)
CREATININE: 0.91 mg/dL (ref 0.60–1.30)
ESTIMATED GFR: 98 mL/min/{1.73_m2} (ref >59)
GLUCOSE: 92 mg/dL (ref 74–109)
OSMOLALITY, CALCULATED: 280 mosm/kg (ref 270–290)
POTASSIUM: 4.1 mmol/L (ref 3.5–5.1)
SODIUM: 139 mmol/L (ref 136–145)

## 2021-07-06 LAB — URINALYSIS, MICROSCOPIC
RBCS: 1 /HPF (ref <4)
WBCS: <1 /HPF (ref <6)

## 2021-07-07 LAB — ECG 12 LEAD
Atrial Rate: 65 {beats}/min
Calculated R Axis: 89 degrees
Calculated T Axis: 59 degrees
PR Interval: 154 ms
QRS Duration: 112 ms
QT Interval: 410 ms
QTC Calculation: 426 ms
Ventricular rate: 65 {beats}/min

## 2021-07-10 ENCOUNTER — Inpatient Hospital Stay
Admission: RE | Admit: 2021-07-10 | Discharge: 2021-07-10 | Disposition: A | Discharge status: Home / Self Care | Payer: BC Managed Care – PPO | Source: Ambulatory Visit | Attending: Orthopaedic Surgery | Admitting: Orthopaedic Surgery

## 2021-07-10 ENCOUNTER — Encounter (HOSPITAL_COMMUNITY)
Admission: RE | Disposition: A | Discharge status: Home / Self Care | Payer: Self-pay | Source: Ambulatory Visit | Attending: Orthopaedic Surgery

## 2021-07-10 ENCOUNTER — Encounter (HOSPITAL_COMMUNITY): Payer: Self-pay | Admitting: Orthopaedic Surgery

## 2021-07-10 ENCOUNTER — Ambulatory Visit (HOSPITAL_COMMUNITY): Payer: BC Managed Care – PPO | Admitting: Anesthesiology

## 2021-07-10 ENCOUNTER — Encounter (HOSPITAL_COMMUNITY): Payer: BC Managed Care – PPO | Admitting: Orthopaedic Surgery

## 2021-07-10 ENCOUNTER — Other Ambulatory Visit: Payer: Self-pay

## 2021-07-10 DIAGNOSIS — S83242A Other tear of medial meniscus, current injury, left knee, initial encounter: Secondary | ICD-10-CM | POA: Insufficient documentation

## 2021-07-10 DIAGNOSIS — S83282A Other tear of lateral meniscus, current injury, left knee, initial encounter: Secondary | ICD-10-CM | POA: Insufficient documentation

## 2021-07-10 HISTORY — DX: Unspecified osteoarthritis, unspecified site: M19.90

## 2021-07-10 HISTORY — DX: Gastro-esophageal reflux disease without esophagitis: K21.9

## 2021-07-10 HISTORY — DX: Pure hypercholesterolemia, unspecified: E78.00

## 2021-07-10 HISTORY — DX: Allergic rhinitis, unspecified: J30.9

## 2021-07-10 HISTORY — DX: Anxiety disorder, unspecified: F41.9

## 2021-07-10 HISTORY — DX: Essential (primary) hypertension: I10

## 2021-07-10 SURGERY — ARTHROSCOPY KNEE
Anesthesia: General | Site: Knee | Laterality: Left | Wound class: Clean Wound: Uninfected operative wounds in which no inflammation occurred

## 2021-07-10 MED ORDER — FAMOTIDINE (PF) 20 MG/2 ML INTRAVENOUS SOLUTION
INTRAVENOUS | Status: AC
Start: 2021-07-10 — End: 2021-07-10
  Filled 2021-07-10: qty 2

## 2021-07-10 MED ORDER — LACTATED RINGERS INTRAVENOUS SOLUTION
INTRAVENOUS | Status: DC
Start: 2021-07-10 — End: 2021-07-10

## 2021-07-10 MED ORDER — FENTANYL (PF) 50 MCG/ML INJECTION WRAPPER
50.0000 ug | INJECTION | INTRAMUSCULAR | Status: DC | PRN
Start: 2021-07-10 — End: 2021-07-10
  Administered 2021-07-10: 50 ug via INTRAVENOUS

## 2021-07-10 MED ORDER — MIDAZOLAM 5 MG/ML INJECTION WRAPPER
3.0000 mg | Freq: Once | INTRAMUSCULAR | Status: DC | PRN
Start: 2021-07-10 — End: 2021-07-10

## 2021-07-10 MED ORDER — SODIUM CHLORIDE 0.9 % (FLUSH) INJECTION SYRINGE
3.0000 mL | INJECTION | Freq: Three times a day (TID) | INTRAMUSCULAR | Status: DC
Start: 2021-07-10 — End: 2021-07-10

## 2021-07-10 MED ORDER — FENTANYL (PF) 50 MCG/ML INJECTION WRAPPER
INJECTION | Freq: Once | INTRAMUSCULAR | Status: DC | PRN
Start: 2021-07-10 — End: 2021-07-10
  Administered 2021-07-10 (×2): 50 ug via INTRAVENOUS

## 2021-07-10 MED ORDER — ALBUTEROL SULFATE 2.5 MG/3 ML (0.083 %) SOLUTION FOR NEBULIZATION
2.5000 mg | INHALATION_SOLUTION | Freq: Once | RESPIRATORY_TRACT | Status: DC | PRN
Start: 2021-07-10 — End: 2021-07-10

## 2021-07-10 MED ORDER — FENTANYL (PF) 50 MCG/ML INJECTION WRAPPER
25.0000 ug | INJECTION | INTRAMUSCULAR | Status: DC | PRN
Start: 2021-07-10 — End: 2021-07-10

## 2021-07-10 MED ORDER — ROPIVACAINE (PF) 2 MG/ML (0.2 %) INJECTION SOLUTION
INTRAMUSCULAR | Status: AC
Start: 2021-07-10 — End: 2021-07-10
  Filled 2021-07-10: qty 10

## 2021-07-10 MED ORDER — HYDROCODONE 7.5 MG-ACETAMINOPHEN 325 MG TABLET
1.0000 | ORAL_TABLET | ORAL | 0 refills | Status: DC | PRN
Start: 2021-07-10 — End: 2022-01-08

## 2021-07-10 MED ORDER — IPRATROPIUM 0.5 MG-ALBUTEROL 3 MG (2.5 MG BASE)/3 ML NEBULIZATION SOLN
3.0000 mL | INHALATION_SOLUTION | Freq: Once | RESPIRATORY_TRACT | Status: DC | PRN
Start: 2021-07-10 — End: 2021-07-10

## 2021-07-10 MED ORDER — PROCHLORPERAZINE EDISYLATE 10 MG/2 ML (5 MG/ML) INJECTION SOLUTION
5.0000 mg | Freq: Once | INTRAMUSCULAR | Status: DC | PRN
Start: 2021-07-10 — End: 2021-07-10

## 2021-07-10 MED ORDER — FAMOTIDINE (PF) 20 MG/2 ML INTRAVENOUS SOLUTION
20.0000 mg | Freq: Once | INTRAVENOUS | Status: AC
Start: 2021-07-10 — End: 2021-07-10
  Administered 2021-07-10: 20 mg via INTRAVENOUS

## 2021-07-10 MED ORDER — DEXAMETHASONE SODIUM PHOSPHATE 4 MG/ML INJECTION SOLUTION
INTRAMUSCULAR | Status: AC
Start: 2021-07-10 — End: 2021-07-10
  Filled 2021-07-10: qty 1

## 2021-07-10 MED ORDER — DEXAMETHASONE SODIUM PHOSPHATE 4 MG/ML INJECTION SOLUTION
4.0000 mg | Freq: Once | INTRAMUSCULAR | Status: AC
Start: 2021-07-10 — End: 2021-07-10
  Administered 2021-07-10: 4 mg via INTRAVENOUS

## 2021-07-10 MED ORDER — FENTANYL (PF) 50 MCG/ML INJECTION SOLUTION
INTRAMUSCULAR | Status: AC
Start: 2021-07-10 — End: 2021-07-10
  Filled 2021-07-10: qty 2

## 2021-07-10 MED ORDER — SODIUM CHLORIDE 0.9 % (FLUSH) INJECTION SYRINGE
3.0000 mL | INJECTION | INTRAMUSCULAR | Status: DC | PRN
Start: 2021-07-10 — End: 2021-07-10

## 2021-07-10 MED ORDER — ONDANSETRON HCL (PF) 4 MG/2 ML INJECTION SOLUTION
4.0000 mg | Freq: Once | INTRAMUSCULAR | Status: AC
Start: 2021-07-10 — End: 2021-07-10
  Administered 2021-07-10: 4 mg via INTRAVENOUS

## 2021-07-10 MED ORDER — ONDANSETRON HCL (PF) 4 MG/2 ML INJECTION SOLUTION
4.0000 mg | Freq: Once | INTRAMUSCULAR | Status: DC | PRN
Start: 2021-07-10 — End: 2021-07-10

## 2021-07-10 MED ORDER — LACTATED RINGERS INTRAVENOUS SOLUTION
INTRAVENOUS | Status: DC | PRN
Start: 2021-07-10 — End: 2021-07-10

## 2021-07-10 MED ORDER — MORPHINE 10 MG/ML INJECTION WRAPPER
INTRAVENOUS | Status: AC
Start: 2021-07-10 — End: 2021-07-10
  Filled 2021-07-10: qty 1

## 2021-07-10 MED ORDER — OXYCODONE-ACETAMINOPHEN 5 MG-325 MG TABLET
1.0000 | ORAL_TABLET | Freq: Once | ORAL | Status: DC | PRN
Start: 2021-07-10 — End: 2021-07-10
  Administered 2021-07-10: 1 via ORAL
  Filled 2021-07-10: qty 1

## 2021-07-10 MED ORDER — HYDROMORPHONE 2 MG/ML INJECTION WRAPPER
1.0000 mg | INJECTION | Freq: Once | INTRAMUSCULAR | Status: DC | PRN
Start: 2021-07-10 — End: 2021-07-10

## 2021-07-10 MED ORDER — ROPIVACAINE (PF) 2 MG/ML (0.2 %) INJECTION SOLUTION
Freq: Once | INTRAVENOUS | Status: DC | PRN
Start: 2021-07-10 — End: 2021-07-10
  Administered 2021-07-10: 10 mL via INTRAMUSCULAR

## 2021-07-10 MED ORDER — LIDOCAINE (PF) 100 MG/5 ML (2 %) INTRAVENOUS SYRINGE
INJECTION | Freq: Once | INTRAVENOUS | Status: DC | PRN
Start: 2021-07-10 — End: 2021-07-10
  Administered 2021-07-10: 80 mg via INTRAVENOUS

## 2021-07-10 MED ORDER — ONDANSETRON HCL (PF) 4 MG/2 ML INJECTION SOLUTION
INTRAMUSCULAR | Status: AC
Start: 2021-07-10 — End: 2021-07-10
  Filled 2021-07-10: qty 2

## 2021-07-10 MED ORDER — PROPOFOL 10 MG/ML IV BOLUS
INJECTION | Freq: Once | INTRAVENOUS | Status: DC | PRN
Start: 2021-07-10 — End: 2021-07-10
  Administered 2021-07-10: 200 mg via INTRAVENOUS

## 2021-07-10 SURGICAL SUPPLY — 55 items
BAG BIOHAZ RD 30X24IN THK3 MIL C8-10GL LLDPE INFCT WASTE CAN (MED SURG SUPPLIES) ×1
BAG BIOHAZ RD 30X24IN THK3 MIL C8-10GL LLDPE INFCT WASTE CAN PNCT RST (MED SURG SUPPLIES) ×1
BAG BIOHAZ RD 43X30IN THK3 MIL C20-30GL LLDPE INFCT WASTE (MED SURG SUPPLIES) ×1
BAG BIOHAZ RD 43X30IN THK3 MIL C20-30GL LLDPE INFCT WASTE CAN PNCT RST (MED SURG SUPPLIES) ×1 IMPLANT
BANDAGE ELT 5.8YDX4IN NONST SLFCLS ELAS KNIT TEAL END STCH (WOUND CARE/ENTEROSTOMAL SUPPLY) ×1
BANDAGE ELT 5.8YDX4IN NONST SLFCLS ELAS KNIT TEAL END STCH VELCRO COTTON POLY BLND STD LGTH COMPRESS (WOUND CARE SUPPLY) ×1 IMPLANT
BANDAGE ESMARK 9FTX6IN STRL SYN COMPRESS LF (WOUND CARE SUPPLY) ×1 IMPLANT
BANDAGE ESMARK 9FTX6IN STRL SY_N COMPRESS LF (WOUND CARE/ENTEROSTOMAL SUPPLY) ×1
BLADE 11 2 END CBNSTL SURG STRL DISP (CUTTING ELEMENTS) ×1
BLADE 11 2 END CBNSTL SURG STRL DISP (SURGICAL CUTTING SUPPLIES) ×1 IMPLANT
BLADE SHAVER 13CM 4MM COOLCUT 2 CUT POWER SFT TISS RESCT STRL DISP (ENDOSCOPIC SUPPLIES) ×1 IMPLANT
BLADE SHAVER 13CM 4MM COOLCUT_2 CUT PWR SFT TISS RESCT STRL (INSTRUMENTS ENDOMECHANICAL) ×1
BLADE SURG CLPR PVT ADJ HEAD 9661 STRL LF  DISP PURP (MED SURG SUPPLIES) ×1 IMPLANT
BLADE SURG CLPR PVT ADJ HEAD 9661 STRL LF DISP PURP (MED SURG SUPPLIES) ×1
CLEANER INSTR PREPZYME MUL-TRD CONTAINR NARSL NEUT PH BDGR (MISCELLANEOUS PT CARE ITEMS) ×1
CONV USE ITEM 321837 - GLOVE SURG 7.5 LTX PF NONST CRM (GLOVES AND ACCESSORIES) ×1 IMPLANT
CONV USE ITEM 321854 - GLOVE SURG 6 LF  BEAD CUF SMOOTH HI GRIP WHT 12IN MDCHC PLISPRN (GLOVES AND ACCESSORIES) ×1 IMPLANT
CONV USE ITEM 321983 - GLOVE SURG 8 LTX CHEMO PF SMOOTH BEAD CUF STRL WHT 11.6IN PLMR THK.2MM THK.21MM (GLOVES AND ACCESSORIES) ×1 IMPLANT
CONV USE ITEM 322852 - ROLL ECODRI-SAFE ABS POLY BCK ORTHO (ORTHOPEDICS (NOT IMPLANTS)) ×2 IMPLANT
CONV USE ITEM 329146 - CLEANER INSTR PREPZYME MUL-TRD CONTAINR NARSL NEUT PH BDGR 22OZ (MISCELLANEOUS PT CARE ITEMS) ×1 IMPLANT
CONV USE ITEM 338662 - PACK SURG ASCP STRL DISP ~~LOC~~ BPT MED CNTR LF (CUSTOM TRAYS & PACK) ×1 IMPLANT
CONV USE ITEM 81225 - BAG BIOHAZ RD 30X24IN THK3 MIL C8-10GL LLDPE INFCT WASTE CAN PNCT RST (MED SURG SUPPLIES) ×1 IMPLANT
COUNTER 20 CNT BLOCK ADH NEEDLE STRL LF  RD SHARP FOAM 15.75X11.5X14IN DISP (MED SURG SUPPLIES) ×1 IMPLANT
COUNTER 20 CNT BLOCK ADH NEEDLE STRL LF RD SHARP FOAM 15.75 (MED SURG SUPPLIES) ×1
COVER TBL 90X50IN STD SMS REINF FNFLD STRL LF  DISP (DRAPE/PACKS/SHEETS/OR TOWEL) ×1 IMPLANT
COVER TBL 90X50IN STD SMS REINF FNFLD STRL LF DISP (DRAPE/PACKS/SHEETS/OR TOWEL) ×1
DISC USE 162338 - NEEDLE HYPO 18GA 1.5IN TW BD_POLYPROP REG BVL LL HUB (MED SURG SUPPLIES) ×1
DISCONTINUED USE 31829 - NEEDLE HYPO 18GA 1.5IN TW BD_POLYPROP REG BVL LL HUB (MED SURG SUPPLIES) ×1 IMPLANT
DURAPREP 26ML 8630 CS/20 (MED SURG SUPPLIES) ×1
GLOVE SURG 6 LF PF SMOOTH STRL WHT PLISPRN (GLOVES AND ACCESSORIES) ×1
GLOVE SURG 6.5 LF  PF BEAD CUF SMOOTH TXTR STRL GRN 12IN SENSICARE PLISPRN SYN PLMR ALOE THK7.9 MIL (GLOVES AND ACCESSORIES) ×1 IMPLANT
GLOVE SURG 6.5 LF PF BEAD CUF SMOOTH TXTR STRL GRN 12IN (GLOVES AND ACCESSORIES) ×1
GLOVE SURG 7.5 LTX PF SMOOTH STRL CRM (GLOVES AND ACCESSORIES) ×1
GLOVE SURG 8 LTX PF SMOOTH STRL CRM (GLOVES AND ACCESSORIES) ×1
HDPE THK22 UM C40-45 GL L48 IN X W40 IN NATURAL (MISCELLANEOUS PT CARE ITEMS) ×2 IMPLANT
LABEL MED CORRECT MED LABELING SYS 4 FLG 2 SHEET 24 PRPRNT (MED SURG SUPPLIES) ×1
LABEL MED CORRECT MED LABELING SYS 4 FLG 2 SHEET 24 PRPRNT STRL (MED SURG SUPPLIES) ×1 IMPLANT
NEEDLE SPINAL PNK 3.5IN 18GA QUINCKE REG WL POLYPROP QUINCKE TIP STRL LF  DISP (MED SURG SUPPLIES) ×1 IMPLANT
NEEDLE SPINAL PNK 3.5IN 18GA Q_UINCKE STD LGTH BVL FIT STY (MED SURG SUPPLIES) ×1
PACK ARTHROGRAM MDLN INDUSTRIES INC. RAD GEN N/M S DISP (CUSTOM TRAYS & PACK) ×1
PACK SURG ASCP STRL DISP ~~LOC~~ BPT MED CNTR LF (CUSTOM TRAYS & PACK) ×1
PMP TUBING 13FT CONT WV III DU_ALWAVE ARTHRO STRL DISP (MED SURG SUPPLIES) ×1
PUMP TUBING 13FT CONT WV III DUALWAVE ARTHRO STRL DISP (MED SURG SUPPLIES) ×1 IMPLANT
ROLL ECODRI-SAFE ABS POLY BCK ORTHO (ORTHOPEDICS (NOT IMPLANTS)) ×2
SOL IRRG LR 3L PRSV N-PYRG FLXB CONTAINR STRL LF (MEDICATIONS/SOLUTIONS) ×2 IMPLANT
SOL SURG PREP 26ML DRPRP 74% ISPRP 0.7% IOD POVACRYLEX SLF CNTN APPL SKIN STRL PREOP (MED SURG SUPPLIES) ×1 IMPLANT
SPONGE GAUZE 4X4IN MDCHC COTTON 12 PLY TY 7 LF  STRL DISP (WOUND CARE SUPPLY) ×1 IMPLANT
SPONGE GAUZE 4X4IN MDCHC COTTO_N 12 PLY TY 7 LF STRL DISP (WOUND CARE/ENTEROSTOMAL SUPPLY) ×1
SUTURE 4-0 C-14 SURGIPRO2 18IN BLU MONOF NONAB (SUTURE/WOUND CLOSURE) ×2 IMPLANT
SYRINGE LL 10ML LF  STRL GRAD N-PYRG DEHP-FR PVC FREE MED DISP (MED SURG SUPPLIES) ×1 IMPLANT
SYRINGE LL 10ML LF STRL MED D_ISP (MED SURG SUPPLIES) ×1
TUBING CONN 6MM X 3.7M (MED SURG SUPPLIES) ×1
TUBING EXT PIECE AR-6220 BX/20 (INSTRUMENTS ENDOMECHANICAL) ×1
TUBING IRRG 6FT DUALWAVE BKFL CK VALVE EXT STRL DISP (ENDOSCOPIC SUPPLIES) ×1 IMPLANT
TUBING SUCT CLR 12FT .25IN ARGYLE PVC NCDTV STR MALE FEMALE MLD CONN STRL LF (MED SURG SUPPLIES) ×1 IMPLANT

## 2021-07-10 NOTE — Interval H&P Note (Signed)
H & P updated the day of the procedure.  1.  H&P completed within 30 days of surgical procedure and has been reviewed within 24 hours of admission but prior to surgery or a procedure requiring anesthesia services, the patient has been examined, and no change has occured in the patients condition since the H&P was completed.       Change in medications: No        No LMP recorded.      Comments:     2.  Patient continues to be appropriate candidate for planned surgical procedure. YES    Heraclio Seidman, DO

## 2021-07-10 NOTE — Discharge Instructions (Addendum)
FOLLOW UP IN OFFICE ON Jul 23, 2021 AT 3:25 PM ( YOU WILL SEE Robert Medina)    LEAVE CURRENT DRESSING ON FOR 48/72 HRS THEN MAY REMOVE AND SHOWER YOU MAY THEN USE BANDAIDS OVER SURGICAL SITE    FULL WEIGHT BEARING AS TOLERATED,  MAY USE CRUTCHES FOR SUPPORT THEN DISCARD WHEN NO LONGER NEEDED    MAY USE WRAPPED ICE PACK 20 MINUTES ON THEN 20 MINUTES OFF, DO NOT APPLY DIRECTLY TO SKIN    MAY ELEVATE LEG WHILE AT REST TO DECREASE SWELLING    MAY RESUME HOME MEDICATIONS    NEW PRESCRIPTION AVAILABLE AT YOUR PHARMACY

## 2021-07-10 NOTE — H&P (Signed)
Paper H and P on chart, will be scanned to EMR.

## 2021-07-10 NOTE — Anesthesia Postprocedure Evaluation (Signed)
Anesthesia Post Op Evaluation    Patient: Robert Medina  Procedure(s):  DIAGNOSTIC ARTHROSCOPY LEFT KNEE WITH PARTIAL MEDIAL AND LATERAL MENISCECTOMY    Last Vitals:Temperature: 36.4 C (97.5 F) (07/10/21 0910)  Heart Rate: 56 (07/10/21 0910)  BP (Non-Invasive): (!) 146/87 (07/10/21 0910)  Respiratory Rate: 16 (07/10/21 0910)  SpO2: 95 % (07/10/21 0910)    No notable events documented.    Patient is sufficiently recovered from the effects of anesthesia to participate in the evaluation and has returned to their pre-procedure level.  Patient location during evaluation: PACU       Patient participation: complete - patient participated  Level of consciousness: awake and alert and responsive to verbal stimuli    Pain management: adequate  Airway patency: patent    Anesthetic complications: no  Cardiovascular status: acceptable  Respiratory status: acceptable  Hydration status: acceptable  Patient post-procedure temperature: Pt Normothermic   PONV Status: Absent

## 2021-07-10 NOTE — OR Surgeon (Signed)
[facility]   Operative Note   PATIENT NAME:  Robert Medina, Robert Medina  MRN:  [id number]  DOB:  June 14, 1965    Date of Procedure:  07/10/2021  Preoperative Diagnosis: LEFT KNEE LATERAL MENISCUS TEAR   Postoperative Diagnoses:  LEFT KNEE MEDIAL AND LATERAL MENISCUS TEAR   Procedure Performed: Procedure(s) (LRB):  DIAGNOSTIC ARTHROSCOPY LEFT KNEE WITH PARTIAL MEDIAL AND LATERAL MENISCECTOMY (Left)    Surgeon: Quenton Fetter, DO   Anesthesia: General  Estimated Blood Loss: Minimal  Complications: None immediate  Description of Procedure patient taken to the operating room given a general anesthetic tourniquet placed over padding on the left thigh leg prepped with DuraPrep draped in a sterile manner after being placed in the knee holder.  Esmarch exsanguination tourniquet to 350 mmHg medial and lateral portals utilized exam of the knee performed patellofemoral joint without abnormality medial gutter was clear medial compartment with posterior horn tear of the medial meniscus excised with basket forceps and shaver back to a stable edge notch probed and palpated with intact cruciate ligaments lateral compartment with a lateral meniscal tear excised with basket forceps and shaver back to stable edge peripheral probed and stable lateral gutter was clear.  Knee lavaged injected with Naropin portals closed with 5 0 Prolene suture in an interrupted fashion sterile bandage applied tourniquet released patient awoken from anesthesia and brought to recovery in stable condition.  Quenton Fetter, DO   This note was partially generated using MModal Fluency Direct system, and there may be some incorrect words, spellings, and punctuation that were not noted in checking the note before saving.

## 2021-07-10 NOTE — Anesthesia Transfer of Care (Signed)
ANESTHESIA TRANSFER OF CARE   Robert Medina is a 56 y.o. ,male, Weight: 115 kg (253 lb)   had Procedure(s):  DIAGNOSTIC ARTHROSCOPY LEFT KNEE WITH PARTIAL MEDIAL AND LATERAL MENISCECTOMY  performed  07/10/21   Primary Service: Marcha Dutton, DO    Past Medical History:   Diagnosis Date   . Allergic rhinitis    . Anxiety    . Esophageal reflux    . Hypercholesterolemia    . Hypertension    . Osteoarthritis       Allergy History as of 07/10/21     SHELLFISH CONTAINING PRODUCTS       Noted Status Severity Type Reaction    07/10/21 0614 Duffy, Lattie Haw, CPhT 07/24/20 Active       Comments: Other reaction(s): Other (See Comments)  Unsure of reaction; due to childhood allergies was advised to avoid shellfish               I completed my transfer of care / handoff to the receiving personnel during which we discussed:  Access, Airway, All key/critical aspects of case discussed, Analgesia, Antibiotics, Expectation of post procedure, Fluids/Product, Gave opportunity for questions and acknowledgement of understanding, Labs and PMHx    Post Location: PACU                                          Additional Info:LMA removed in PACU                        Last OR Temp: Temperature: 36.7 C (98 F)  ABG:  POTASSIUM   Date Value Ref Range Status   07/06/2021 4.1 3.5 - 5.1 mmol/L Final     KETONES   Date Value Ref Range Status   07/06/2021 Negative Negative, Trace mg/dL Final     CALCIUM   Date Value Ref Range Status   07/06/2021 9.9 8.6 - 10.3 mg/dL Final     Calculated R Axis   Date Value Ref Range Status   07/06/2021 89 degrees Final     Calculated T Axis   Date Value Ref Range Status   07/06/2021 59 degrees Final     Airway:  LMA #4 (Active)     Blood pressure (!) 140/99, pulse 72, temperature 36.7 C (98 F), resp. rate 20, height 1.829 m (6'), weight 115 kg (253 lb), SpO2 98 %.

## 2021-07-10 NOTE — OR Nursing (Signed)
PATIENT IDENTIFIED? YES  DRUG ALLERGIES VERIFIED? YES  CONSENT AND SIDE VERIFIED? PER PT & chart   PICTURES TAKEN? YES

## 2021-07-10 NOTE — Anesthesia Preprocedure Evaluation (Addendum)
ANESTHESIA PRE-OP EVALUATION  Planned Procedure: DIAGNOSTIC ARTHROSCOPY LEFT KNEE WITH POSSIBLE REPAIR VERSUS EXCISION OF LATERAL MENISCUS (Left: Knee)  Review of Systems                   Pulmonary     Cardiovascular    Hypertension ,       GI/Hepatic/Renal    GERD (very well controlled)        Endo/Other    osteoarthritis,      Neuro/Psych/MS    anxiety     Cancer                      Physical Assessment      Airway       Mallampati: II      Mouth Opening: good.            Dental                    Pulmonary           Cardiovascular             Other findings            Plan  ASA 3     Planned anesthesia type: general

## 2021-08-07 ENCOUNTER — Other Ambulatory Visit (INDEPENDENT_AMBULATORY_CARE_PROVIDER_SITE_OTHER): Payer: Self-pay | Admitting: Internal Medicine

## 2021-08-07 DIAGNOSIS — G4700 Insomnia, unspecified: Secondary | ICD-10-CM

## 2021-09-03 DIAGNOSIS — M546 Pain in thoracic spine: Secondary | ICD-10-CM | POA: Insufficient documentation

## 2021-09-25 ENCOUNTER — Other Ambulatory Visit (INDEPENDENT_AMBULATORY_CARE_PROVIDER_SITE_OTHER): Payer: Self-pay | Admitting: Internal Medicine

## 2021-09-25 DIAGNOSIS — F064 Anxiety disorder due to known physiological condition: Secondary | ICD-10-CM

## 2021-09-25 DIAGNOSIS — I10 Essential (primary) hypertension: Secondary | ICD-10-CM

## 2021-10-03 DIAGNOSIS — M4310 Spondylolisthesis, site unspecified: Secondary | ICD-10-CM | POA: Insufficient documentation

## 2021-10-05 ENCOUNTER — Other Ambulatory Visit (INDEPENDENT_AMBULATORY_CARE_PROVIDER_SITE_OTHER): Payer: Self-pay | Admitting: Internal Medicine

## 2021-10-05 DIAGNOSIS — F064 Anxiety disorder due to known physiological condition: Secondary | ICD-10-CM

## 2021-11-16 DIAGNOSIS — E7849 Other hyperlipidemia: Secondary | ICD-10-CM | POA: Insufficient documentation

## 2021-11-16 DIAGNOSIS — I10 Essential (primary) hypertension: Secondary | ICD-10-CM | POA: Insufficient documentation

## 2021-11-16 DIAGNOSIS — G4709 Other insomnia: Secondary | ICD-10-CM | POA: Insufficient documentation

## 2021-11-16 DIAGNOSIS — J309 Allergic rhinitis, unspecified: Secondary | ICD-10-CM | POA: Insufficient documentation

## 2021-11-16 DIAGNOSIS — F419 Anxiety disorder, unspecified: Secondary | ICD-10-CM | POA: Insufficient documentation

## 2021-11-16 DIAGNOSIS — M109 Gout, unspecified: Secondary | ICD-10-CM | POA: Insufficient documentation

## 2021-11-16 DIAGNOSIS — K589 Irritable bowel syndrome without diarrhea: Secondary | ICD-10-CM | POA: Insufficient documentation

## 2021-11-19 DIAGNOSIS — Z981 Arthrodesis status: Secondary | ICD-10-CM | POA: Insufficient documentation

## 2021-11-29 ENCOUNTER — Encounter (INDEPENDENT_AMBULATORY_CARE_PROVIDER_SITE_OTHER): Payer: Self-pay | Admitting: Internal Medicine

## 2021-12-10 ENCOUNTER — Encounter (INDEPENDENT_AMBULATORY_CARE_PROVIDER_SITE_OTHER): Payer: Self-pay | Admitting: Internal Medicine

## 2021-12-10 ENCOUNTER — Other Ambulatory Visit (INDEPENDENT_AMBULATORY_CARE_PROVIDER_SITE_OTHER): Payer: Self-pay | Admitting: Internal Medicine

## 2021-12-10 DIAGNOSIS — J309 Allergic rhinitis, unspecified: Secondary | ICD-10-CM

## 2021-12-10 DIAGNOSIS — Z13220 Encounter for screening for lipoid disorders: Secondary | ICD-10-CM

## 2021-12-10 DIAGNOSIS — E785 Hyperlipidemia, unspecified: Secondary | ICD-10-CM

## 2021-12-10 DIAGNOSIS — I10 Essential (primary) hypertension: Secondary | ICD-10-CM

## 2021-12-10 DIAGNOSIS — M109 Gout, unspecified: Secondary | ICD-10-CM

## 2021-12-10 DIAGNOSIS — K589 Irritable bowel syndrome without diarrhea: Secondary | ICD-10-CM

## 2021-12-30 ENCOUNTER — Other Ambulatory Visit (INDEPENDENT_AMBULATORY_CARE_PROVIDER_SITE_OTHER): Payer: Self-pay | Admitting: Internal Medicine

## 2021-12-30 DIAGNOSIS — G4700 Insomnia, unspecified: Secondary | ICD-10-CM

## 2021-12-31 NOTE — Telephone Encounter (Signed)
Needs approval

## 2022-01-02 ENCOUNTER — Encounter (INDEPENDENT_AMBULATORY_CARE_PROVIDER_SITE_OTHER): Payer: Self-pay | Admitting: Internal Medicine

## 2022-01-08 ENCOUNTER — Ambulatory Visit (INDEPENDENT_AMBULATORY_CARE_PROVIDER_SITE_OTHER): Payer: BC Managed Care – PPO | Admitting: Internal Medicine

## 2022-01-08 ENCOUNTER — Other Ambulatory Visit: Payer: Self-pay

## 2022-01-08 ENCOUNTER — Encounter (INDEPENDENT_AMBULATORY_CARE_PROVIDER_SITE_OTHER): Payer: Self-pay | Admitting: Internal Medicine

## 2022-01-08 VITALS — BP 127/78 | HR 55 | Resp 18 | Ht 72.0 in | Wt 252.0 lb

## 2022-01-08 DIAGNOSIS — E559 Vitamin D deficiency, unspecified: Secondary | ICD-10-CM | POA: Insufficient documentation

## 2022-01-08 DIAGNOSIS — I10 Essential (primary) hypertension: Secondary | ICD-10-CM

## 2022-01-08 DIAGNOSIS — R972 Elevated prostate specific antigen [PSA]: Secondary | ICD-10-CM | POA: Insufficient documentation

## 2022-01-08 DIAGNOSIS — Z800 Family history of malignant neoplasm of digestive organs: Secondary | ICD-10-CM

## 2022-01-08 DIAGNOSIS — E538 Deficiency of other specified B group vitamins: Secondary | ICD-10-CM | POA: Insufficient documentation

## 2022-01-08 DIAGNOSIS — K219 Gastro-esophageal reflux disease without esophagitis: Secondary | ICD-10-CM

## 2022-01-08 DIAGNOSIS — R739 Hyperglycemia, unspecified: Secondary | ICD-10-CM | POA: Insufficient documentation

## 2022-01-08 DIAGNOSIS — Z0000 Encounter for general adult medical examination without abnormal findings: Secondary | ICD-10-CM

## 2022-01-08 DIAGNOSIS — E7800 Pure hypercholesterolemia, unspecified: Secondary | ICD-10-CM | POA: Insufficient documentation

## 2022-01-08 DIAGNOSIS — R5382 Chronic fatigue, unspecified: Secondary | ICD-10-CM

## 2022-01-08 NOTE — Progress Notes (Signed)
[facility]  [address]  [place] [address]       Name: KENTREL CLEVENGER MRN:  [id number]   Date: 01/08/2022 Age: 56 y.o.       OUTPATIENT PROGRESS NOTE    Subjective:   Patient ID:  Mr. Nyquist is a pleasant 56 y.o. male.    Chief Complaint: Annual Exam      History of Present Illness:  Pt presents for a well adult exam.  The Comprehensive Health Assessment was completed by the patient and reviewed with them.    Diet:  "healthy" diet  in general  Activity level: active lifestyle  Sleep: inconsistent bed times  Compliant with taking medications: No    Patient Active Problem List   Diagnosis    Hypertension    Hypercholesterolemia    Elevated PSA    Elevated blood sugar    B12 deficiency    Vitamin D deficiency    Esophageal reflux       Acute issues:  complaints      The history is provided by the patient.      Allergies:     Allergies   Allergen Reactions    Shellfish Containing Products      Other reaction(s): Other (See Comments)  Unsure of reaction; due to childhood allergies was advised to avoid shellfish         Medications:   allopurinoL (ZYLOPRIM) 100 mg Oral Tablet, TAKE 1 TABLET by mouth DAILY  ALPRAZolam (XANAX) 0.5 mg Oral Tablet, take 1/2 to 1 Tablet by mouth three times daily, as needed  aspirin (ECOTRIN) 81 mg Oral Tablet, Delayed Release (E.C.), Take 1 Tablet (81 mg total) by mouth Once a day  butalbital-acetaminophen-caffeine (FIORICET) 50-325-40 mg Oral Tablet, 1 TO 2 Tablet EVERY 6 HOURS AS NEEDED  cetirizine (ZYRTEC) 10 mg Oral Tablet, Take 1 Tablet (10 mg total) by mouth Once a day  ezetimibe (ZETIA) 10 mg Oral Tablet, TAKE 1 TABLET by mouth DAILY  fenofibrate micronized (LOFIBRA) 134 mg Oral Capsule, TAKE 1 CAPSULE by mouth DAILY  fluticasone propionate (FLONASE) 50 mcg/actuation Nasal Spray, Suspension, Administer 2 Sprays into each nostril Once a day  hydroCHLOROthiazide (HYDRODIURIL) 25 mg Oral Tablet, TAKE 1 TABLET by mouth  DAILY  methocarbamoL (ROBAXIN) 500 mg Oral Tablet, Take 1 Tablet (500 mg total) by mouth Four times a day  metoprolol succinate (TOPROL-XL) 50 mg Oral Tablet Sustained Release 24 hr, TAKE 1 TABLET by mouth DAILY  montelukast (SINGULAIR) 10 mg Oral Tablet, TAKE 1 TABLET by mouth DAILY  multivitamin-iron-folic acid (CENTRUM) 18-400 mg-mcg Oral Tablet, Take 1 Tablet by mouth Once a day  omega-3 fatty acid (LOVAZA) 1 gram Oral Capsule, TAKE 2 CAPSULES TWICE DAILY  omeprazole (PRILOSEC) 40 mg Oral Capsule, Delayed Release(E.C.), TAKE 1 CAPSULE by mouth DAILY  pregabalin (LYRICA) 50 mg Oral Capsule, Take 1 Capsule (50 mg total) by mouth Twice daily  rosuvastatin (CRESTOR) 20 mg Oral Tablet, TAKE 1 TABLET by mouth DAILY  sertraline (ZOLOFT) 50 mg Oral Tablet, TAKE 1 TABLET DAILY  temazepam (RESTORIL) 15 mg Oral Capsule, TAKE 1 CAPSULE AT BEDTIME  HYDROcodone-acetaminophen (NORCO) 7.5-325 mg Oral Tablet, Take 1 Tablet by mouth Every 4 hours as needed for Pain (Patient not taking: Reported on 01/08/2022)    No facility-administered medications prior to visit.        Immunization History:     There is no immunization history on file for this patient.  Past Medical History:     Past Medical History:   Diagnosis Date    Allergic rhinitis     Anxiety     B12 deficiency     Elevated blood sugar     Elevated PSA     Esophageal reflux     Gout, unspecified     Hypercholesterolemia     Hypertension     IBS (irritable bowel syndrome)     Insomnia     Migraine     Osteoarthritis     Sciatica of right side     Vitamin D deficiency        Past Surgical History:     Past Surgical History:   Procedure Laterality Date    COLONOSCOPY      Polyp removal 2017    SPINE SURGERY      [facility]             Family History:     Family Medical History:       Problem Relation (Age of Onset)    Alzheimer's/Dementia Mother    Esophageal cancer Brother, Brother                Social History:   Oluwadamilare Tobler [place]  reports that he has never  smoked. He has never used smokeless tobacco. He reports current alcohol use of about 12 cans of beer per week. He reports that he does not use drugs and does not engage in sexual activity.        Review of Systems:     Constitutional   Denies: Fatigue, Fever  Eyes   Denies: Ocular Pain/Tenderness, Changes in Vision  HENT   Denies: Nasal Congestion Hoarsness, Headaches, Vertigo/Dizziness  Cardiovascular:   Denies: Chest Pain, Dyspnea on Exertion, Lower Extremity Edema  Respiratory:   Denies: Shortness of Breath, Cough  Gastrointestinal:   Denies: Reflux, Diarrhea, Constipation, Abdominal Pain  Genitourinary:   Denies: Urgency, Frequency, Dysuria  Integument:   Denies: Rash  Neurologic:   Denies: Muscular Weakness, Memory Difficulties  Musculoskeletal:   Denies: Joint Pain  Endocrine:   Denies: Cold Intolerance, Heat Intolerance  Psychiatric:   Denies: Anxiety, Depression, Difficulty Sleeping  Heme-Lymph:   Denies: Easy Bruising, Lymph Node Enlargement or Tenderness      Objective:   Vitals:    Vitals:    01/08/22 1025   BP: 127/78   Pulse: 55   Resp: 18   SpO2: 95%   Weight: 114 kg (252 lb)   Height: 1.829 m (6')   BMI: 34.25          Body mass index is 34.18 kg/m.      Constitutional: Alert, well developed, well nourished  HEENT:  Head: NC/AT    Eyes: Sclera anicteric, conjunctiva not injected    Ears: EAC normal, bilateral TMs clear    Nose: No discharge    Throat: MMM, posterior pharynx without erythema or exudate  Neck:   Supple with normal ROM, no cervical LAD, no thyromegaly, no JVD, no carotid bruits  Cardiovascular: RRR, normal S1/S2, no murmurs/rubs/gallops  Pulmonary:  CTAB, equal air entry, nonlabored, no wheezes/crackles/rhonchi  Abdomen:   NABS, NT/ND, soft, no HSM, no masses  Musculoskeletal:  No deformity, no injury, no edema  Neurological:   Alert, oriented x 3, no abnormal tone  Skin:     Warm, pink, dry, no rashes, no jaundice, no pallor, no cyanosis  Psychiatric:  Normal mood, affect, behavior,  judgment, and thought  content      Labs:     Documentation Only on 11/29/2021   Component Date Value Ref Range Status    HEMOGLOBIN A1C 01/08/2021 5.3   Final    CHOLESTEROL 01/08/2021 149   Final    HDL-CHOLESTEROL 01/08/2021 54   Final    LDL (CALCULATED) 01/08/2021 58   Final    TRIGLYCERIDES 01/08/2021 183   Final    LDL CHOLESTEROL,DIRECT 01/08/2021 37   Final    PROSTATE SPECIFIC AG 01/08/2021 1.07   Final       Assessment & Plan:       ICD-10-CM    1. Annual physical exam  Z00.00 Exam Performed      2. Primary hypertension  I10 Blood Pressure today is stable and will continue current treatment plans     CBC     URINALYSIS, MACROSCOPIC AND MICROSCOPIC W/CULTURE REFLEX     COMPREHENSIVE METABOLIC PANEL, NON-FASTING      3. Hypercholesterolemia  E78.00 Labs ordered here and on this visit will be be addressed by me. I will contact patient and address therapy options once completed.     LIPID PANEL      4. Elevated PSA  R97.20 Labs ordered here and on this visit will be be addressed by me. I will contact patient and address therapy options once completed.     PSA, DIAGNOSTIC      5. Elevated blood sugar  R73.9       6. B12 deficiency  E53.8 Labs ordered here and on this visit will be be addressed by me. I will contact patient and address therapy options once completed.     VITAMIN B12      7. Vitamin D deficiency  E55.9       8. Gastroesophageal reflux disease without esophagitis  K21.9 Condition stable will continue current therapy.        9. Family history of esophageal cancer  Z80.0 Referral to GENERAL SURGERY - [place] - [facility]      10. Chronic fatigue  R53.82 Labs ordered here and on this visit will be be addressed by me. I will contact patient and address therapy options once completed.     THYROID STIMULATING HORMONE WITH FREE T4 REFLEX        Orders Placed This Encounter    CBC    LIPID PANEL    VITAMIN B12    URINALYSIS, MACROSCOPIC AND MICROSCOPIC W/CULTURE REFLEX    COMPREHENSIVE METABOLIC PANEL,  NON-FASTING    THYROID STIMULATING HORMONE WITH FREE T4 REFLEX    PSA, DIAGNOSTIC    Referral to GENERAL SURGERY - [place] - [facility]       Depression screening is negative. PHQ 2 Total: 0         Health Maintenance   Topic Date Due    Influenza Vaccine (1) Never done    Adult Tdap-Td (1 - Tdap) 01/09/2023 (Originally 07/17/1984)    Shingles Vaccine (1 of 2) 01/09/2023 (Originally 07/16/2013)    Depression Screening  01/09/2023    Prostate Cancer Screening  01/09/2023    Colonoscopy  07/29/2029    Meningococcal Vaccine  Aged Out    Pneumococcal Vaccine, Age 59-64  Aged Out    Hepatitis B Vaccine  Discontinued    Hepatitis C screening  Discontinued    HIV Screening  Discontinued    Covid-19 Vaccine  Discontinued         Return in about 6 months (around 07/09/2022).  The patient has been educated and verbalized understanding regarding the services provided during this visit.      Lucia Gaskins, DO @ on 01/08/22 at 10:51.

## 2022-01-13 LAB — PSA, DIAGNOSTIC: PROSTATE SPECIFIC AG: 1.5

## 2022-01-13 LAB — LIPID PANEL
CHOLESTEROL: 108
HDL-CHOLESTEROL: 39
LDL (CALCULATED): 44
TRIGLYCERIDES: 148
VLDL (CALCULATED): 25

## 2022-01-14 ENCOUNTER — Encounter (INDEPENDENT_AMBULATORY_CARE_PROVIDER_SITE_OTHER): Payer: Self-pay | Admitting: Internal Medicine

## 2022-01-14 DIAGNOSIS — E7800 Pure hypercholesterolemia, unspecified: Secondary | ICD-10-CM

## 2022-01-14 DIAGNOSIS — I10 Essential (primary) hypertension: Secondary | ICD-10-CM

## 2022-01-14 DIAGNOSIS — R972 Elevated prostate specific antigen [PSA]: Secondary | ICD-10-CM

## 2022-01-14 DIAGNOSIS — R5382 Chronic fatigue, unspecified: Secondary | ICD-10-CM

## 2022-01-14 DIAGNOSIS — E538 Deficiency of other specified B group vitamins: Secondary | ICD-10-CM

## 2022-01-18 ENCOUNTER — Ambulatory Visit (INDEPENDENT_AMBULATORY_CARE_PROVIDER_SITE_OTHER): Payer: BC Managed Care – PPO | Admitting: Surgery

## 2022-01-18 ENCOUNTER — Encounter (INDEPENDENT_AMBULATORY_CARE_PROVIDER_SITE_OTHER): Payer: Self-pay | Admitting: Surgery

## 2022-01-18 ENCOUNTER — Other Ambulatory Visit: Payer: Self-pay

## 2022-01-18 VITALS — BP 121/78 | HR 57 | Ht 72.0 in | Wt 258.0 lb

## 2022-01-18 DIAGNOSIS — Z800 Family history of malignant neoplasm of digestive organs: Secondary | ICD-10-CM

## 2022-01-18 DIAGNOSIS — K219 Gastro-esophageal reflux disease without esophagitis: Secondary | ICD-10-CM

## 2022-01-18 NOTE — Progress Notes (Signed)
[facility] [place]  [address]  [place] [address]  Phone: [phone]  Fax: [phone]    Encounter Date: 01/18/2022    Patient ID:  Robert Medina  [id number]    DOB: 05-15-1965  Age: 56 y.o. male    Subjective:     Chief Complaint   Patient presents with    Referral     Pt ref from Dr. Belva Crome for EGD   Brother at age 73 esophageal cancer  Second brother has been dx with this he is concerned      HPI:   Patient has a longstanding history of gastroesophageal reflux disease for which she has been taking omeprazole.  He still has some epigastric burning pain symptoms but no dysphagia.  No nausea or vomiting.  No hematemesis melena or hematochezia.  No history of jaundice.  He has irritable bowel syndrome but bowel symptoms are under control.  His last colonoscopy in May of 2021 was normal except for external hemorrhoids.  Prior to that he had a tiny polyp removed.    He has strong family history of esophageal cancer in 2 of his brothers.    Current Outpatient Medications   Medication Sig    allopurinoL (ZYLOPRIM) 100 mg Oral Tablet TAKE 1 TABLET by mouth DAILY    ALPRAZolam (XANAX) 0.5 mg Oral Tablet take 1/2 to 1 Tablet by mouth three times daily, as needed    aspirin (ECOTRIN) 81 mg Oral Tablet, Delayed Release (E.C.) Take 1 Tablet (81 mg total) by mouth Once a day    butalbital-acetaminophen-caffeine (FIORICET) 50-325-40 mg Oral Tablet 1 TO 2 Tablet EVERY 6 HOURS AS NEEDED    cetirizine (ZYRTEC) 10 mg Oral Tablet Take 1 Tablet (10 mg total) by mouth Once a day    ezetimibe (ZETIA) 10 mg Oral Tablet TAKE 1 TABLET by mouth DAILY    fenofibrate micronized (LOFIBRA) 134 mg Oral Capsule TAKE 1 CAPSULE by mouth DAILY    fluticasone propionate (FLONASE) 50 mcg/actuation Nasal Spray, Suspension Administer 2 Sprays into each nostril Once a day    hydroCHLOROthiazide (HYDRODIURIL) 25 mg Oral Tablet TAKE 1 TABLET by mouth DAILY    methocarbamoL (ROBAXIN) 500 mg Oral Tablet Take 1  Tablet (500 mg total) by mouth Four times a day    metoprolol succinate (TOPROL-XL) 50 mg Oral Tablet Sustained Release 24 hr TAKE 1 TABLET by mouth DAILY    montelukast (SINGULAIR) 10 mg Oral Tablet TAKE 1 TABLET by mouth DAILY    multivitamin-iron-folic acid (CENTRUM) 123XX123 mg-mcg Oral Tablet Take 1 Tablet by mouth Once a day    omega-3 fatty acid (LOVAZA) 1 gram Oral Capsule TAKE 2 CAPSULES TWICE DAILY    omeprazole (PRILOSEC) 40 mg Oral Capsule, Delayed Release(E.C.) TAKE 1 CAPSULE by mouth DAILY    pregabalin (LYRICA) 50 mg Oral Capsule Take 1 Capsule (50 mg total) by mouth Twice daily    rosuvastatin (CRESTOR) 20 mg Oral Tablet TAKE 1 TABLET by mouth DAILY    sertraline (ZOLOFT) 50 mg Oral Tablet TAKE 1 TABLET DAILY    temazepam (RESTORIL) 15 mg Oral Capsule TAKE 1 CAPSULE AT BEDTIME     Allergies   Allergen Reactions    Shellfish Containing Products      Other reaction(s): Other (See Comments)  Unsure of reaction; due to childhood allergies was advised to avoid shellfish     Past Medical History:   Diagnosis Date    Allergic rhinitis     Anxiety  B12 deficiency     Elevated blood sugar     Elevated PSA     Esophageal reflux     Gout, unspecified     Hypercholesterolemia     Hypertension     IBS (irritable bowel syndrome)     Insomnia     Migraine     Osteoarthritis     Sciatica of right side     Vitamin D deficiency          Past Surgical History:   Procedure Laterality Date    COLONOSCOPY      Polyp removal 2017    SPINE SURGERY      [facility]         Family Medical History:       Problem Relation (Age of Onset)    Alzheimer's/Dementia Mother    Esophageal cancer Brother, Brother            Social History     Tobacco Use    Smoking status: Never    Smokeless tobacco: Never   Vaping Use    Vaping Use: Never used   Substance Use Topics    Alcohol use: Yes     Alcohol/week: 12.0 standard drinks of alcohol     Types: 12 Cans of beer per week    Drug use: Never       ROS:   Patient does not have any  transient ischemic attacks.  No active chest pains or palpitations.  No cough hemoptysis or dyspnea.  No fever chills or night sweats.  No other abdominal complaints.  No urinary complaints.  No claudication.  Previous records reviewed and the notes from PCP also reviewed  Objective:   Vitals: BP 121/78 (Site: Right, Patient Position: Sitting, Cuff Size: Adult)   Pulse 57   Ht 1.829 m (6')   Wt 117 kg (258 lb)   SpO2 94%   BMI 34.99 kg/m         Physical Exam:  Patient is fully awake and alert fully oriented and in no distress.  Vital signs are stable.  He is overweight.  Neck supple   Lungs are clear   Heart sounds normal   Abdomen is soft without any tenderness or palpable mass.    Assessment & Plan:     ENCOUNTER DIAGNOSES     ICD-10-CM   1. Gastroesophageal reflux disease without esophagitis  K21.9   2. Family history of esophageal cancer  Z80.0   Procedure was scheduled for upper endoscopy patient understands and agrees and is scheduled for November 20th.    No orders of the defined types were placed in this encounter.      No follow-ups on file.    Olevia Perches, MD

## 2022-01-25 ENCOUNTER — Other Ambulatory Visit (INDEPENDENT_AMBULATORY_CARE_PROVIDER_SITE_OTHER): Payer: Self-pay | Admitting: Internal Medicine

## 2022-01-25 DIAGNOSIS — G43009 Migraine without aura, not intractable, without status migrainosus: Secondary | ICD-10-CM

## 2022-01-25 NOTE — Telephone Encounter (Signed)
Needs approval

## 2022-01-28 ENCOUNTER — Inpatient Hospital Stay
Admission: RE | Admit: 2022-01-28 | Discharge: 2022-01-28 | Disposition: A | Discharge status: Home / Self Care | Payer: BC Managed Care – PPO | Source: Ambulatory Visit | Attending: Surgery | Admitting: Surgery

## 2022-01-28 ENCOUNTER — Encounter (HOSPITAL_COMMUNITY): Payer: Self-pay | Admitting: Surgery

## 2022-01-28 ENCOUNTER — Encounter (HOSPITAL_COMMUNITY)
Admission: RE | Disposition: A | Discharge status: Home / Self Care | Payer: Self-pay | Source: Ambulatory Visit | Attending: Surgery

## 2022-01-28 ENCOUNTER — Ambulatory Visit (HOSPITAL_COMMUNITY): Payer: BC Managed Care – PPO | Admitting: Certified Registered"

## 2022-01-28 ENCOUNTER — Other Ambulatory Visit: Payer: Self-pay

## 2022-01-28 DIAGNOSIS — Z800 Family history of malignant neoplasm of digestive organs: Secondary | ICD-10-CM

## 2022-01-28 DIAGNOSIS — K2950 Unspecified chronic gastritis without bleeding: Secondary | ICD-10-CM | POA: Insufficient documentation

## 2022-01-28 DIAGNOSIS — K2100 Gastro-esophageal reflux disease with esophagitis, without bleeding: Secondary | ICD-10-CM | POA: Insufficient documentation

## 2022-01-28 HISTORY — PX: ESOPHAGOSCOPY / EGD: SUR461

## 2022-01-28 SURGERY — GASTROSCOPY WITH BIOPSY
Anesthesia: General | Wound class: Clean Contaminated Wounds-The respiratory, GI, Genital, or urinary

## 2022-01-28 MED ORDER — DEXTROSE 5 % AND LACTATED RINGERS INTRAVENOUS SOLUTION
INTRAVENOUS | Status: DC | PRN
Start: 2022-01-28 — End: 2022-01-28

## 2022-01-28 MED ORDER — PROPOFOL 10 MG/ML INTRAVENOUS EMULSION
Freq: Once | INTRAVENOUS | Status: DC | PRN
Start: 2022-01-28 — End: 2022-01-28
  Administered 2022-01-28: 160 mg via INTRAVENOUS

## 2022-01-28 MED ORDER — PROPOFOL 10 MG/ML IV BOLUS
INJECTION | Freq: Once | INTRAVENOUS | Status: DC | PRN
Start: 2022-01-28 — End: 2022-01-28
  Administered 2022-01-28: 100 mg via INTRAVENOUS

## 2022-01-28 MED ORDER — LIDOCAINE (PF) 100 MG/5 ML (2 %) INTRAVENOUS SYRINGE
INJECTION | Freq: Once | INTRAVENOUS | Status: DC | PRN
Start: 2022-01-28 — End: 2022-01-28
  Administered 2022-01-28: 150 mg via INTRAVENOUS

## 2022-01-28 SURGICAL SUPPLY — 2 items
FORCEPS BIOPSY MICROMESH TTH STREAMLINE CATH NEEDLE 240CM 2.4MM RJ 4 SS LRG CPC STRL DISP ORNG 2.8MM (ENDOSCOPIC SUPPLIES) ×1 IMPLANT
FORCEPS BIOPSY NEEDLE 240CM 2._4MM RJ 4 2.8MM LRG CPC STRL (INSTRUMENTS ENDOMECHANICAL) ×1

## 2022-01-28 NOTE — Discharge Instructions (Addendum)
SURGICAL DISCHARGE INSTRUCTIONS     Dr. Olevia Perches, MD  performed your EGD WITH BIOPSY today at the [facility]    [facility]:  Monday through Friday from 8 a.m. - 4 p.m.: [phone]    For T&D: [phone]  Between 4 p.m. - 8 a.m., weekends and holidays:  Call ER [phone]    PLEASE SEE WRITTEN HANDOUTS AS DISCUSSED BY YOUR NURSE:  ***    SIGNS AND SYMPTOMS OF A WOUND / INCISION INFECTION   Be sure to watch for the following:  Increase in redness or red streaks near or around the wound or incision.  Increase in pain that is intense or severe and cannot be relieved by the pain medication that your doctor has given you.  Increase in swelling that cannot be relieved by elevation of a body part, or by applying ice, if permitted.  Increase in drainage, or if yellow / green in color and smells bad. This could be on a dressing or a cast.  Increase in fever for longer than 24 hours, or an increase that is higher than 101 degrees Fahrenheit (normal body temperature is 98 degrees Fahrenheit). The incision may feel warm to the touch.    **CALL YOUR DOCTOR IF ONE OR MORE OF THESE SIGNS / SYMPTOMS SHOULD OCCUR.    ANESTHESIA INFORMATION   ANESTHESIA -- ADULT PATIENTS:  You have received intravenous sedation / general anesthesia, and you may feel drowsy and light-headed for several hours. You may even experience some forgetfulness of the procedure. DO NOT DRIVE A MOTOR VEHICLE or perform any activity requiring complete alertness or coordination until you feel fully awake in about 24-48 hours. Do not drink alcoholic beverages for at least 24 hours. Do not stay alone, you must have a responsible adult available to be with you. You may also experience a dry mouth or nausea for 24 hours. This is a normal side effect and will disappear as the effects of the medication wear off.    REMEMBER   If you experience any difficulty breathing, chest pain, bleeding that you feel is excessive,  persistent nausea or vomiting or for any other concerns:  Call your physician Dr.  Olevia Perches, MD   at [phone] . You may also ask to have the general doctor on call paged. They are available to you 24 hours a day.      SPECIAL INSTRUCTIONS / COMMENTS   POST-OP DIAGNOSIS--ANTRAL GASTRITIS, ESOPHAGITIS  REST TODAY--DO NOT DRIVE OR OPERATE ELECTRICAL EQUIPMENT OR SIGN LEGAL DOCUMENTS FOR 24 HOURS  RETURN APPOINTMENT WITH DR I RANA ON WEDNESDAY--February 06, 2022 AT 11;15 am    FOLLOW-UP APPOINTMENTS   Please call your surgeon's office at the number listed to schedule a date / time of return for follow-up.     {PRN Surgical Providers:[id number]}

## 2022-01-28 NOTE — H&P (Signed)
[facility]  H&P Update Form    Robert Medina, Robert Medina, 56 y.o. male  Encounter Start Date: 01/18/2022   Date of Birth:  1965/05/27    01/28/2022    STOP: IF H&P IS GREATER THAN 30 DAYS FROM SURGICAL DAY COMPLETE NEW H&P IS REQUIRED.     H & P updated the day of the procedure.  1.  H&P was completed within 30 days of surgical procedure by Olevia Perches, MD  and has been reviewed within 24 hours of the surgery. The patient has been examined, and no change has occured in the patients condition since the H&P was completed.       Change in medications: No                  2.  Patient continues to be appropiate candidate for planned surgical procedure. YES      Olevia Perches, MD

## 2022-01-28 NOTE — Anesthesia Preprocedure Evaluation (Signed)
ANESTHESIA PRE-OP EVALUATION  Planned Procedure: EGD WITH BIOPSY  Review of Systems     anesthesia history negative     patient summary reviewed          Pulmonary     Cardiovascular    Hypertension and hyperlipidemia , Exercise Tolerance: > or = 4 METS        GI/Hepatic/Renal    GERD        Endo/Other    osteoarthritis,      Neuro/Psych/MS    headaches, anxiety     Cancer                        Physical Assessment      Airway       Mallampati: I    TM distance: >3 FB    Neck ROM: full  Mouth Opening: good.  No Facial hair          Dental                    Pulmonary    Breath sounds clear to auscultation       Cardiovascular    Rhythm: regular  Rate: Normal       Other findings              Plan  ASA 3     Planned anesthesia type: general     total intravenous anesthesia                  Intravenous induction     Anesthesia issues/risks discussed are: PONV.  Anesthetic plan and risks discussed with patient  signed consent obtained            Patient's NPO status is appropriate for Anesthesia.

## 2022-01-28 NOTE — OR Surgeon (Signed)
[facility]    OPERATIVE NOTE    Patient Name: Robert Medina, Robert Medina MRN:: [id number]  Date of Birth: May 04, 1965  Date of Service: 01/28/2022     Pre-Operative Diagnosis: FAMILY HISTORY ESOPHAGEAL CANCER     Post-Operative Diagnosis: Antral Gastritis, possible Barrett's esophagitis    Procedure(s)/Description:  EGD WITH BIOPSY: 43239 (CPT)     Attending Surgeon: Olevia Perches, MD     Assistant Surgeon:     Anesthesia Staff:  CRNA: Jenne Pane, CRNA    Anesthesia Type: .General     Estimated Blood Loss:  Minimal    Complications:  None    Condition:  Stable    Disposition:   PACU - hemodynamically stable.    Indications:  Patient is a 56 y.o. male who presents for esophagogastroduodenoscopy for history of gastroesophageal reflux disease and strong family history of esophageal cancer in 2 of his brothers .    Description of Procedure/Operation:  Patient was in the left lateral position under sedation.  Gastroscope was passed through the pharynx without difficulty then advanced under video monitoring through the esophagus which was noted to be normal except just at the GE junction there appears to be a creeping of columnar mucosa in the distal esophagus which may represent Barrett's esophagitis.  The mucosa lines were smooth there were no ulcerations or other lesions.  Stomach was entered and there were multiple linear erosions and erythema in the antrum and pre-pyloric area.  Pylorus opened properly duodenum was visualized to be normal.  Pictures were taken.  A gastric biopsy was taken from antrum particularly to look for H pylori.  Retroflexed view of the fundus was unremarkable.  A couple of tiny fundic type polyps were noted.  Biopsy was taken from the GE junction.  Instrument was removed.  Patient tolerated well.    Olevia Perches, MD

## 2022-01-28 NOTE — Anesthesia Postprocedure Evaluation (Signed)
Anesthesia Post Op Evaluation    Patient: Robert Medina  Procedure(s):  EGD WITH BIOPSY    Last Vitals:Temperature: 36.6 C (97.8 F) (01/28/22 1238)  Heart Rate: 60 (01/28/22 0937)  BP (Non-Invasive): (!) 143/80 (01/28/22 3016)  Respiratory Rate: 20 (01/28/22 0937)  SpO2: 96 % (01/28/22 0937)    No notable events documented.    Patient is sufficiently recovered from the effects of anesthesia to participate in the evaluation and has returned to their pre-procedure level.  Patient location during evaluation: PACU       Patient participation: complete - patient participated  Level of consciousness: awake and alert and responsive to verbal stimuli    Pain score: 0  Pain management: adequate  Airway patency: patent    Anesthetic complications: no  Cardiovascular status: acceptable  Respiratory status: acceptable  Hydration status: acceptable  Patient post-procedure temperature: Pt Normothermic   PONV Status: Absent

## 2022-01-29 LAB — SURGICAL PATHOLOGY SPECIMEN

## 2022-01-30 ENCOUNTER — Other Ambulatory Visit (INDEPENDENT_AMBULATORY_CARE_PROVIDER_SITE_OTHER): Payer: Self-pay | Admitting: Internal Medicine

## 2022-01-30 DIAGNOSIS — F064 Anxiety disorder due to known physiological condition: Secondary | ICD-10-CM

## 2022-01-30 NOTE — Telephone Encounter (Signed)
Needs approval

## 2022-02-06 ENCOUNTER — Encounter (INDEPENDENT_AMBULATORY_CARE_PROVIDER_SITE_OTHER): Payer: Self-pay | Admitting: Surgery

## 2022-03-26 ENCOUNTER — Other Ambulatory Visit (INDEPENDENT_AMBULATORY_CARE_PROVIDER_SITE_OTHER): Payer: Self-pay | Admitting: Internal Medicine

## 2022-03-26 DIAGNOSIS — F064 Anxiety disorder due to known physiological condition: Secondary | ICD-10-CM

## 2022-05-02 ENCOUNTER — Other Ambulatory Visit (INDEPENDENT_AMBULATORY_CARE_PROVIDER_SITE_OTHER): Payer: Self-pay | Admitting: Internal Medicine

## 2022-05-02 DIAGNOSIS — I10 Essential (primary) hypertension: Secondary | ICD-10-CM

## 2022-05-31 ENCOUNTER — Other Ambulatory Visit (INDEPENDENT_AMBULATORY_CARE_PROVIDER_SITE_OTHER): Payer: Self-pay | Admitting: Internal Medicine

## 2022-05-31 DIAGNOSIS — G4700 Insomnia, unspecified: Secondary | ICD-10-CM

## 2022-05-31 DIAGNOSIS — F064 Anxiety disorder due to known physiological condition: Secondary | ICD-10-CM

## 2022-06-15 ENCOUNTER — Other Ambulatory Visit (INDEPENDENT_AMBULATORY_CARE_PROVIDER_SITE_OTHER): Payer: Self-pay | Admitting: Internal Medicine

## 2022-06-15 DIAGNOSIS — M109 Gout, unspecified: Secondary | ICD-10-CM

## 2022-06-15 DIAGNOSIS — K589 Irritable bowel syndrome without diarrhea: Secondary | ICD-10-CM

## 2022-06-15 DIAGNOSIS — J309 Allergic rhinitis, unspecified: Secondary | ICD-10-CM

## 2022-06-15 DIAGNOSIS — Z13220 Encounter for screening for lipoid disorders: Secondary | ICD-10-CM

## 2022-06-15 DIAGNOSIS — E785 Hyperlipidemia, unspecified: Secondary | ICD-10-CM

## 2022-06-15 DIAGNOSIS — I10 Essential (primary) hypertension: Secondary | ICD-10-CM

## 2022-06-29 ENCOUNTER — Other Ambulatory Visit (INDEPENDENT_AMBULATORY_CARE_PROVIDER_SITE_OTHER): Payer: Self-pay | Admitting: Internal Medicine

## 2022-06-29 DIAGNOSIS — I10 Essential (primary) hypertension: Secondary | ICD-10-CM

## 2022-08-06 ENCOUNTER — Encounter (INDEPENDENT_AMBULATORY_CARE_PROVIDER_SITE_OTHER): Payer: Self-pay

## 2022-08-07 ENCOUNTER — Other Ambulatory Visit: Payer: Self-pay

## 2022-08-07 ENCOUNTER — Ambulatory Visit (INDEPENDENT_AMBULATORY_CARE_PROVIDER_SITE_OTHER): Payer: BC Managed Care – PPO | Admitting: Surgery

## 2022-08-07 ENCOUNTER — Encounter (INDEPENDENT_AMBULATORY_CARE_PROVIDER_SITE_OTHER): Payer: Self-pay | Admitting: Surgery

## 2022-08-07 VITALS — BP 113/73 | HR 56 | Ht 72.0 in | Wt 261.0 lb

## 2022-08-07 DIAGNOSIS — K219 Gastro-esophageal reflux disease without esophagitis: Secondary | ICD-10-CM

## 2022-08-07 NOTE — Progress Notes (Signed)
[facility]  [address]  [place] [address]  Phone: [phone]  Fax: [phone]    Encounter Date: 08/07/2022    Patient ID:  Robert Medina  [id number]    DOB: 06-28-1965  Age: 57 y.o. male    Subjective:     Chief Complaint   Patient presents with    Post Operative Status     Post op EGD 01/28/22   Pt is wanting to change his stomach medication      HPI:   Patient had upper endoscopy in November of 2023 that showed reflux esophagitis.  There were no H pylori noted.  There was no evidence of Barrett's esophagus.    Patient claimed that he has been taking the medication for acid reflux for almost 15 years  Currently with the omeprazole is completely asymptomatic has absolutely no GI symptoms.  No GI bleeding and no other problems.    Current Outpatient Medications   Medication Sig    allopurinoL (ZYLOPRIM) 100 mg Oral Tablet TAKE 1 TABLET by mouth DAILY    ALPRAZolam (XANAX) 0.5 mg Oral Tablet Take 1/2 to 1 Tablet THREE TIMES DAILY AS NEEDED    aspirin (ECOTRIN) 81 mg Oral Tablet, Delayed Release (E.C.) Take 1 Tablet (81 mg total) by mouth Once a day    butalbital-acetaminophen-caffeine (FIORICET) 50-325-40 mg Oral Tablet take 1 TO 2 Tablet EVERY 6 HOURS AS NEEDED    cetirizine (ZYRTEC) 10 mg Oral Tablet Take 1 Tablet (10 mg total) by mouth Once a day    ezetimibe (ZETIA) 10 mg Oral Tablet TAKE 1 TABLET by mouth DAILY    fenofibrate micronized (LOFIBRA) 134 mg Oral Capsule TAKE 1 CAPSULE by mouth DAILY    fluticasone propionate (FLONASE) 50 mcg/actuation Nasal Spray, Suspension Administer 2 Sprays into each nostril Once a day    hydroCHLOROthiazide (HYDRODIURIL) 25 mg Oral Tablet TAKE 1 TABLET by mouth DAILY    methocarbamoL (ROBAXIN) 500 mg Oral Tablet Take 1 Tablet (500 mg total) by mouth Four times a day    metoprolol succinate (TOPROL-XL) 50 mg Oral Tablet Sustained Release 24 hr TAKE 1 TABLET by mouth DAILY    montelukast (SINGULAIR) 10 mg Oral Tablet TAKE 1 TABLET  by mouth DAILY    multivitamin-iron-folic acid (CENTRUM) 18-400 mg-mcg Oral Tablet Take 1 Tablet by mouth Once a day    naphazoline-pheniramine (NAPHCON-A) 0.025-0.3 % Ophthalmic Drops Administer 1 Drop into affected eye(s)    omega-3 fatty acid (LOVAZA) 1 gram Oral Capsule TAKE 2 CAPSULES by mouth TWICE DAILY    omeprazole (PRILOSEC) 40 mg Oral Capsule, Delayed Release(E.C.) TAKE 1 CAPSULE by mouth DAILY    pregabalin (LYRICA) 50 mg Oral Capsule Take 1 Capsule (50 mg total) by mouth Twice daily    rosuvastatin (CRESTOR) 20 mg Oral Tablet TAKE 1 TABLET by mouth DAILY    sertraline (ZOLOFT) 50 mg Oral Tablet TAKE 1 TABLET DAILY    temazepam (RESTORIL) 15 mg Oral Capsule TAKE 1 CAPSULE by mouth AT BEDTIME     Allergies   Allergen Reactions    Shellfish Containing Products  Other Adverse Reaction (Add comment)     Other reaction(s): Other (See Comments)    Unsure of reaction; due to childhood allergies was advised to avoid shellfish     Past Medical History:   Diagnosis Date    Allergic rhinitis     Anxiety     B12 deficiency     Elevated blood sugar  Elevated PSA     Esophageal reflux     Gout, unspecified     Hypercholesterolemia     Hypertension     IBS (irritable bowel syndrome)     Insomnia     Migraine     Osteoarthritis     Sciatica of right side     Vitamin D deficiency          Past Surgical History:   Procedure Laterality Date    COLONOSCOPY      Polyp removal 2017    ESOPHAGOSCOPY / EGD  01/28/2022    Dr. Olevia Perches    SPINE SURGERY      [facility]         Family Medical History:       Problem Relation (Age of Onset)    Alzheimer's/Dementia Mother    Esophageal cancer Brother, Brother            Social History     Tobacco Use    Smoking status: Never    Smokeless tobacco: Never   Vaping Use    Vaping status: Never Used   Substance Use Topics    Alcohol use: Yes     Alcohol/week: 12.0 standard drinks of alcohol     Types: 12 Cans of beer per week    Drug use: Never       ROS:   No new  complaints  Objective:   Vitals: BP 113/73 (Site: Right, Patient Position: Sitting, Cuff Size: Adult)   Pulse 56   Ht 1.829 m (6')   Wt 118 kg (261 lb)   SpO2 95%   BMI 35.40 kg/m         Physical Exam:  Patient is awake and alert fully oriented and in no distress.  Vital signs are stable  There is no pallor cyanosis or jaundice  Lungs are clear   Heart sounds normal   Abdomen is soft and benign.  There is no epigastric or any other tenderness.    Previous records including upper endoscopy and path report reviewed.    Assessment & Plan:     ENCOUNTER DIAGNOSES     ICD-10-CM   1. Esophageal reflux  K21.9   Have told the patient to go off of omeprazole for now and also explained to him anti-reflux measures p.r.n. use of Gaviscon.  He still has some omeprazole at home so for an occasional heartburn he can use that but if the symptoms recur and I will probably put him back on Pepcid otherwise he will come back in 4 months.    No orders of the defined types were placed in this encounter.      No follow-ups on file.    Olevia Perches, MD

## 2022-09-21 ENCOUNTER — Other Ambulatory Visit (INDEPENDENT_AMBULATORY_CARE_PROVIDER_SITE_OTHER): Payer: Self-pay | Admitting: Internal Medicine

## 2022-09-21 DIAGNOSIS — G4700 Insomnia, unspecified: Secondary | ICD-10-CM

## 2022-09-21 DIAGNOSIS — F064 Anxiety disorder due to known physiological condition: Secondary | ICD-10-CM

## 2022-09-23 NOTE — Telephone Encounter (Signed)
Needs approval

## 2022-12-09 ENCOUNTER — Encounter (INDEPENDENT_AMBULATORY_CARE_PROVIDER_SITE_OTHER): Payer: Self-pay | Admitting: Surgery

## 2022-12-16 ENCOUNTER — Ambulatory Visit (INDEPENDENT_AMBULATORY_CARE_PROVIDER_SITE_OTHER): Payer: BC Managed Care – PPO | Admitting: Surgery

## 2022-12-16 ENCOUNTER — Other Ambulatory Visit (INDEPENDENT_AMBULATORY_CARE_PROVIDER_SITE_OTHER): Payer: Self-pay | Admitting: Internal Medicine

## 2022-12-16 ENCOUNTER — Other Ambulatory Visit: Payer: Self-pay

## 2022-12-16 ENCOUNTER — Encounter (INDEPENDENT_AMBULATORY_CARE_PROVIDER_SITE_OTHER): Payer: Self-pay | Admitting: Surgery

## 2022-12-16 VITALS — BP 134/71 | HR 53 | Ht 72.0 in | Wt 251.0 lb

## 2022-12-16 DIAGNOSIS — E785 Hyperlipidemia, unspecified: Secondary | ICD-10-CM

## 2022-12-16 DIAGNOSIS — F064 Anxiety disorder due to known physiological condition: Secondary | ICD-10-CM

## 2022-12-16 DIAGNOSIS — J309 Allergic rhinitis, unspecified: Secondary | ICD-10-CM

## 2022-12-16 DIAGNOSIS — K219 Gastro-esophageal reflux disease without esophagitis: Secondary | ICD-10-CM

## 2022-12-16 DIAGNOSIS — I10 Essential (primary) hypertension: Secondary | ICD-10-CM

## 2022-12-16 DIAGNOSIS — M109 Gout, unspecified: Secondary | ICD-10-CM

## 2022-12-16 DIAGNOSIS — K589 Irritable bowel syndrome without diarrhea: Secondary | ICD-10-CM

## 2022-12-16 DIAGNOSIS — Z13220 Encounter for screening for lipoid disorders: Secondary | ICD-10-CM

## 2022-12-16 NOTE — Progress Notes (Signed)
[facility]  [address]  [place] [address]  Phone: [phone]  Fax: [phone]    Encounter Date: 12/16/2022    Patient ID:  Robert Medina  [id number]    DOB: [birth year]  Age: 57 y.o. male    Subjective:     Chief Complaint   Patient presents with    Follow Up 6 Months     EGD 01/28/2022        HPI:   This is a follow-up visit.    Patient was doing well and is completely asymptomatic from any GI symptoms however he continues to take Prilosec.  His last EGD in November of 2023 had shown esophagitis and mild gastritis but on last visit he was pretty much asymptomatic so I suggested to discontinue the Prilosec anterior report if any problems however he has not done that and continues to take Prilosec but no new complaints.    Current Outpatient Medications   Medication Sig    allopurinoL (ZYLOPRIM) 100 mg Oral Tablet TAKE 1 TABLET by mouth DAILY    ALPRAZolam (XANAX) 0.5 mg Oral Tablet Take 1/2 to 1 Tablet THREE TIMES DAILY AS NEEDED    aspirin (ECOTRIN) 81 mg Oral Tablet, Delayed Release (E.C.) Take 1 Tablet (81 mg total) by mouth Once a day    butalbital-acetaminophen-caffeine (FIORICET) 50-325-40 mg Oral Tablet take 1 TO 2 Tablet EVERY 6 HOURS AS NEEDED    celecoxib (CELEBREX) 200 mg Oral Capsule     cetirizine (ZYRTEC) 10 mg Oral Tablet Take 1 Tablet (10 mg total) by mouth Once a day    ezetimibe (ZETIA) 10 mg Oral Tablet TAKE 1 TABLET by mouth DAILY    fenofibrate micronized (LOFIBRA) 134 mg Oral Capsule TAKE 1 CAPSULE by mouth DAILY    fluticasone propionate (FLONASE) 50 mcg/actuation Nasal Spray, Suspension Administer 2 Sprays into each nostril Once a day    hydroCHLOROthiazide (HYDRODIURIL) 25 mg Oral Tablet TAKE 1 TABLET by mouth DAILY    methocarbamoL (ROBAXIN) 500 mg Oral Tablet Take 1 Tablet (500 mg total) by mouth Four times a day    metoprolol succinate (TOPROL-XL) 50 mg Oral Tablet Sustained Release 24 hr TAKE 1 TABLET by mouth DAILY    montelukast  (SINGULAIR) 10 mg Oral Tablet TAKE 1 TABLET by mouth DAILY    multivitamin-iron-folic acid (CENTRUM) 18-400 mg-mcg Oral Tablet Take 1 Tablet by mouth Once a day    naphazoline-pheniramine (NAPHCON-A) 0.025-0.3 % Ophthalmic Drops Administer 1 Drop into affected eye(s)    omeprazole (PRILOSEC) 40 mg Oral Capsule, Delayed Release(E.C.) TAKE 1 CAPSULE by mouth DAILY    pregabalin (LYRICA) 50 mg Oral Capsule Take 1 Capsule (50 mg total) by mouth Twice daily    rosuvastatin (CRESTOR) 20 mg Oral Tablet TAKE 1 TABLET by mouth DAILY    sertraline (ZOLOFT) 50 mg Oral Tablet TAKE 1 TABLET by mouth DAILY    temazepam (RESTORIL) 15 mg Oral Capsule TAKE 1 CAPSULE by mouth AT BEDTIME     Allergies   Allergen Reactions    Shellfish Containing Products  Other Adverse Reaction (Add comment)     Other reaction(s): Other (See Comments)    Unsure of reaction; due to childhood allergies was advised to avoid shellfish     Past Medical History:   Diagnosis Date    Allergic rhinitis     Anxiety     B12 deficiency     Elevated blood sugar     Elevated PSA  Esophageal reflux     Gout, unspecified     Hypercholesterolemia     Hypertension     IBS (irritable bowel syndrome)     Insomnia     Migraine     Osteoarthritis     Sciatica of right side     Vitamin D deficiency          Past Surgical History:   Procedure Laterality Date    COLONOSCOPY      Polyp removal 2017    ESOPHAGOSCOPY / EGD  01/28/2022    Dr. Olevia Perches    SPINE SURGERY      [facility]         Family Medical History:       Problem Relation (Age of Onset)    Alzheimer's/Dementia Mother    Esophageal cancer Brother, Brother            Social History     Tobacco Use    Smoking status: Never    Smokeless tobacco: Never   Vaping Use    Vaping status: Never Used   Substance Use Topics    Alcohol use: Yes     Alcohol/week: 12.0 standard drinks of alcohol     Types: 12 Cans of beer per week    Drug use: Never       ROS:   No new complaints  Objective:   Vitals: BP 134/71 (Site: Right  Arm, Patient Position: Sitting, Cuff Size: Adult)   Pulse 53   Ht 1.829 m (6')   Wt 114 kg (251 lb)   SpO2 94%   BMI 34.04 kg/m         Physical Exam:  Patient was fully awake and alert and fully oriented and in no distress.  Vital signs are stable.  There was no scleral icterus or pallor.  Lungs are clear   Heart sounds normal   Abdomen is soft and benign without any tenderness.  There is no epigastric tenderness.    Assessment & Plan:     ENCOUNTER DIAGNOSES     ICD-10-CM   1. Esophageal reflux  K21.9   Patient would continue anti-reflux measures and dietary discretion as discussed before however I told him to so discontinue Prilosec and if he develops any problems give me a call and we may try Pepcid otherwise I will see him back in 6 months.    No orders of the defined types were placed in this encounter.      No follow-ups on file.    Olevia Perches, MD

## 2022-12-19 ENCOUNTER — Other Ambulatory Visit (HOSPITAL_COMMUNITY): Payer: Self-pay | Admitting: Orthopaedic Surgery

## 2022-12-19 DIAGNOSIS — M25562 Pain in left knee: Secondary | ICD-10-CM

## 2023-01-06 ENCOUNTER — Other Ambulatory Visit: Payer: Self-pay

## 2023-01-06 ENCOUNTER — Encounter (INDEPENDENT_AMBULATORY_CARE_PROVIDER_SITE_OTHER): Payer: Self-pay | Admitting: Internal Medicine

## 2023-01-06 ENCOUNTER — Ambulatory Visit (INDEPENDENT_AMBULATORY_CARE_PROVIDER_SITE_OTHER): Payer: BC Managed Care – PPO | Admitting: Internal Medicine

## 2023-01-06 VITALS — BP 132/76 | HR 54 | Ht 72.0 in | Wt 253.0 lb

## 2023-01-06 DIAGNOSIS — K589 Irritable bowel syndrome without diarrhea: Secondary | ICD-10-CM

## 2023-01-06 DIAGNOSIS — D513 Other dietary vitamin B12 deficiency anemia: Secondary | ICD-10-CM

## 2023-01-06 DIAGNOSIS — E785 Hyperlipidemia, unspecified: Secondary | ICD-10-CM

## 2023-01-06 DIAGNOSIS — G4700 Insomnia, unspecified: Secondary | ICD-10-CM

## 2023-01-06 DIAGNOSIS — M109 Gout, unspecified: Secondary | ICD-10-CM

## 2023-01-06 DIAGNOSIS — R739 Hyperglycemia, unspecified: Secondary | ICD-10-CM

## 2023-01-06 DIAGNOSIS — R5382 Chronic fatigue, unspecified: Secondary | ICD-10-CM

## 2023-01-06 DIAGNOSIS — Z23 Encounter for immunization: Secondary | ICD-10-CM

## 2023-01-06 DIAGNOSIS — I10 Essential (primary) hypertension: Secondary | ICD-10-CM

## 2023-01-06 DIAGNOSIS — R351 Nocturia: Secondary | ICD-10-CM

## 2023-01-06 DIAGNOSIS — F064 Anxiety disorder due to known physiological condition: Secondary | ICD-10-CM

## 2023-01-06 DIAGNOSIS — J309 Allergic rhinitis, unspecified: Secondary | ICD-10-CM

## 2023-01-06 DIAGNOSIS — Z0000 Encounter for general adult medical examination without abnormal findings: Secondary | ICD-10-CM

## 2023-01-06 DIAGNOSIS — N401 Enlarged prostate with lower urinary tract symptoms: Secondary | ICD-10-CM

## 2023-01-06 DIAGNOSIS — G43009 Migraine without aura, not intractable, without status migrainosus: Secondary | ICD-10-CM

## 2023-01-06 DIAGNOSIS — Z13220 Encounter for screening for lipoid disorders: Secondary | ICD-10-CM | POA: Insufficient documentation

## 2023-01-06 DIAGNOSIS — E538 Deficiency of other specified B group vitamins: Secondary | ICD-10-CM

## 2023-01-06 MED ORDER — MONTELUKAST 10 MG TABLET
10.0000 mg | ORAL_TABLET | Freq: Every day | ORAL | 0 refills | Status: DC
Start: 2023-01-06 — End: 2023-02-10

## 2023-01-06 MED ORDER — TEMAZEPAM 15 MG CAPSULE
15.0000 mg | ORAL_CAPSULE | Freq: Every evening | ORAL | 3 refills | Status: DC
Start: 2023-01-06 — End: 2023-05-15

## 2023-01-06 MED ORDER — BUTALBITAL-ACETAMINOPHEN-CAFFEINE 50 MG-325 MG-40 MG TABLET
1.0000 | ORAL_TABLET | Freq: Four times a day (QID) | ORAL | 3 refills | Status: DC | PRN
Start: 2023-01-06 — End: 2024-02-03

## 2023-01-06 MED ORDER — EZETIMIBE 10 MG TABLET
10.0000 mg | ORAL_TABLET | Freq: Every day | ORAL | 0 refills | Status: DC
Start: 2023-01-06 — End: 2023-02-10

## 2023-01-06 MED ORDER — ALLOPURINOL 100 MG TABLET
100.0000 mg | ORAL_TABLET | Freq: Every day | ORAL | 0 refills | Status: DC
Start: 2023-01-06 — End: 2023-02-10

## 2023-01-06 MED ORDER — ROSUVASTATIN 20 MG TABLET
20.0000 mg | ORAL_TABLET | Freq: Every day | ORAL | 0 refills | Status: DC
Start: 2023-01-06 — End: 2023-02-10

## 2023-01-06 MED ORDER — FLUTICASONE PROPIONATE 50 MCG/ACTUATION NASAL SPRAY,SUSPENSION: 2.0000 | Freq: Every day | NASAL | 1 refills | Status: DC

## 2023-01-06 MED ORDER — OMEPRAZOLE 40 MG CAPSULE,DELAYED RELEASE
40.0000 mg | DELAYED_RELEASE_CAPSULE | Freq: Every day | ORAL | 0 refills | Status: DC
Start: 2023-01-06 — End: 2023-02-10

## 2023-01-06 MED ORDER — HYDROCHLOROTHIAZIDE 25 MG TABLET
25.0000 mg | ORAL_TABLET | Freq: Every day | ORAL | 0 refills | Status: DC
Start: 2023-01-06 — End: 2023-02-10

## 2023-01-06 MED ORDER — FENOFIBRATE MICRONIZED 134 MG CAPSULE
134.0000 mg | ORAL_CAPSULE | Freq: Every day | ORAL | 0 refills | Status: DC
Start: 2023-01-06 — End: 2023-02-10

## 2023-01-06 MED ORDER — ALPRAZOLAM 0.5 MG TABLET
0.5000 mg | ORAL_TABLET | Freq: Three times a day (TID) | ORAL | 3 refills | Status: DC | PRN
Start: 2023-01-06 — End: 2023-05-15

## 2023-01-06 MED ORDER — SERTRALINE 50 MG TABLET
50.0000 mg | ORAL_TABLET | Freq: Every day | ORAL | 0 refills | Status: DC
Start: 2023-01-06 — End: 2023-02-10

## 2023-01-06 MED ORDER — METOPROLOL SUCCINATE ER 50 MG TABLET,EXTENDED RELEASE 24 HR
50.0000 mg | ORAL_TABLET | Freq: Every day | ORAL | 0 refills | Status: DC
Start: 2023-01-06 — End: 2023-02-10

## 2023-01-06 NOTE — Progress Notes (Signed)
INTERNAL MEDICINE, [facility]  [address]  [place] [address]       Name: Robert Medina MRN:  [id number]   Date: 01/06/2023 Age: 57 y.o.       OUTPATIENT PROGRESS NOTE    Subjective:   Patient ID:  Robert Medina is a pleasant 57 y.o. male.    Chief Complaint: Annual Exam (He is fasting for labs today. He is having some left knee soreness and Dr. Lequita Halt has ordered a MRI, and pulled 60cc of blood on 10/7.  He is doing PT for his back. //Needs refills. )      History of Present Illness:  Pt presents for a well adult exam.  The Comprehensive Health Assessment was completed by the patient and reviewed with them.    Diet:  well balanced  Activity level: active lifestyle  Sleep: Adequate sleep  Compliant with taking medications: Yes    Patient Active Problem List   Diagnosis    Primary hypertension    Other hyperlipidemia    Elevated PSA    Elevated blood sugar    B12 deficiency    Vitamin D deficiency    Esophageal reflux    Thoracic spine pain    Spondylolisthesis of lumbar region    S/P lumbar fusion    Other insomnia    Lumbar disc herniation with radiculopathy    Lumbar degenerative disc disease    Irritable bowel syndrome without diarrhea    Gout    Facet arthritis of lumbar region    Encounter for imaging of lower extremity to assess for fracture alignment or change    Degenerative spondylolisthesis    Anxiety    Allergic rhinitis    Acute right-sided low back pain with right-sided sciatica    Family history of esophageal cancer    Annual physical exam    Gout, unspecified    Anxiety disorder due to known physiological condition    Migraine without aura, not intractable, without status migrainosus    Encounter for screening for lipoid disorders    Essential (primary) hypertension    Hyperlipidemia, unspecified    Insomnia, unspecified    Allergic rhinitis, unspecified       Acute issues:  complaints of left knee pain after trauma. Seeing neurosurgery for his OA of the lumbar  spine      The history is provided by the patient.      Allergies:     Allergies   Allergen Reactions    Shellfish Containing Products  Other Adverse Reaction (Add comment)     Other reaction(s): Other (See Comments)    Unsure of reaction; due to childhood allergies was advised to avoid shellfish         Medications:   aspirin (ECOTRIN) 81 mg Oral Tablet, Delayed Release (E.C.), Take 1 Tablet (81 mg total) by mouth Once a day  celecoxib (CELEBREX) 200 mg Oral Capsule,   cetirizine (ZYRTEC) 10 mg Oral Tablet, Take 1 Tablet (10 mg total) by mouth Once a day  methocarbamoL (ROBAXIN) 500 mg Oral Tablet, Take 1 Tablet (500 mg total) by mouth Four times a day  multivitamin-iron-folic acid (CENTRUM) 18-400 mg-mcg Oral Tablet, Take 1 Tablet by mouth Once a day  naphazoline-pheniramine (NAPHCON-A) 0.025-0.3 % Ophthalmic Drops, Administer 1 Drop into affected eye(s)  allopurinoL (ZYLOPRIM) 100 mg Oral Tablet, TAKE 1 TABLET by mouth DAILY  ALPRAZolam (XANAX) 0.5 mg Oral Tablet, Take 1/2 to 1 Tablet THREE TIMES DAILY AS  NEEDED  butalbital-acetaminophen-caffeine (FIORICET) 50-325-40 mg Oral Tablet, take 1 TO 2 Tablet EVERY 6 HOURS AS NEEDED  ezetimibe (ZETIA) 10 mg Oral Tablet, TAKE 1 TABLET by mouth DAILY  fenofibrate micronized (LOFIBRA) 134 mg Oral Capsule, TAKE 1 CAPSULE by mouth DAILY  fluticasone propionate (FLONASE) 50 mcg/actuation Nasal Spray, Suspension, Administer 2 Sprays into each nostril Once a day  hydroCHLOROthiazide (HYDRODIURIL) 25 mg Oral Tablet, TAKE 1 TABLET by mouth DAILY  metoprolol succinate (TOPROL-XL) 50 mg Oral Tablet Sustained Release 24 hr, TAKE 1 TABLET by mouth DAILY  montelukast (SINGULAIR) 10 mg Oral Tablet, TAKE 1 TABLET by mouth DAILY  omeprazole (PRILOSEC) 40 mg Oral Capsule, Delayed Release(E.C.), TAKE 1 CAPSULE by mouth DAILY  pregabalin (LYRICA) 50 mg Oral Capsule, Take 1 Capsule (50 mg total) by mouth Twice daily (Patient not taking: Reported on 01/06/2023)  rosuvastatin (CRESTOR) 20 mg  Oral Tablet, TAKE 1 TABLET by mouth DAILY  sertraline (ZOLOFT) 50 mg Oral Tablet, TAKE 1 TABLET by mouth DAILY  temazepam (RESTORIL) 15 mg Oral Capsule, TAKE 1 CAPSULE by mouth AT BEDTIME    No facility-administered medications prior to visit.        Immunization History:   There is no immunization history for the selected administration types on file for this patient.      Past Medical History:     Past Medical History:   Diagnosis Date    Allergic rhinitis     Anxiety     B12 deficiency     Elevated blood sugar     Elevated PSA     Esophageal reflux     Gout, unspecified     Hypercholesterolemia     Hypertension     IBS (irritable bowel syndrome)     Insomnia     Migraine     Osteoarthritis     Sciatica of right side     Vitamin D deficiency              Past Surgical History:     Past Surgical History:   Procedure Laterality Date    COLONOSCOPY      Polyp removal 2017    ESOPHAGOSCOPY / EGD  01/28/2022    Dr. Olevia Perches    SPINE SURGERY      [facility]             Family History:     Family Medical History:       Problem Relation (Age of Onset)    Alzheimer's/Dementia Mother    Esophageal cancer Brother, Brother                Social History:   Robert Medina  reports that he has never smoked. He has never used smokeless tobacco. He reports current alcohol use of about 12 cans of beer per week. He reports that he does not use drugs and does not engage in sexual activity.        Review of Systems:     Constitutional   Denies: Fatigue, Fever  Eyes   Denies: Ocular Pain/Tenderness, Changes in Vision  HENT   Denies: Nasal Congestion Hoarsness, Headaches, Vertigo/Dizziness  Cardiovascular:   Denies: Chest Pain, Dyspnea on Exertion, Lower Extremity Edema  Respiratory:   Denies: Shortness of Breath, Cough  Gastrointestinal:   Denies: Reflux, Diarrhea, Constipation, Abdominal Pain  Genitourinary:   Denies: Urgency, Frequency, Dysuria  Integument:   Denies: Rash  Neurologic:   Denies: Muscular Weakness, Memory  Difficulties  Musculoskeletal:  Denies: Joint Pain  Endocrine:   Denies: Cold Intolerance, Heat Intolerance  Psychiatric:   Denies: Anxiety, Depression, Difficulty Sleeping  Heme-Lymph:   Denies: Easy Bruising, Lymph Node Enlargement or Tenderness      Objective:   Vitals:    Vitals:    01/06/23 1015   BP: 132/76   Pulse: 54   SpO2: 95%   Weight: 115 kg (253 lb)   Height: 1.829 m (6')   BMI: 34.38          Body mass index is 34.31 kg/m.      Constitutional: Alert, well developed, well nourished  HEENT:  Head: NC/AT    Eyes: Sclera anicteric, conjunctiva not injected    Ears: EAC normal, bilateral TMs clear    Nose: No discharge    Throat: MMM, posterior pharynx without erythema or exudate  Neck:   Supple with normal ROM, no cervical LAD, no thyromegaly, no JVD, no carotid bruits  Cardiovascular: RRR, normal S1/S2, no murmurs/rubs/gallops  Pulmonary:  CTAB, equal air entry, nonlabored, no wheezes/crackles/rhonchi  Abdomen:   NABS, NT/ND, soft, no HSM, no masses  Musculoskeletal:  No deformity, no injury, no edema  Neurological:   Alert, oriented x 3, no abnormal tone  Skin:     Warm, pink, dry, no rashes, no jaundice, no pallor, no cyanosis  Psychiatric:  Normal mood, affect, behavior, judgment, and thought content      Labs:     No visits with results within 6 Month(s) from this visit.   Latest known visit with results is:   Admission on 01/28/2022, Discharged on 01/28/2022   Component Date Value Ref Range Status    Final Diagnosis 01/28/2022    Final                    Value:A.  Stomach, antrum, biopsy:                                                    Antral type gastric mucosa showing mild chronic inactive gastritis.                          No organisms morphologically compatible with Helicobacter species are seen                           on routine H&E.                          No intestinal metaplasia, dysplasia or malignancy identified.                                                    B. Esophagus,  biopsy:                                                    Fragment of squamocolumnar mucosa and fragments of squamous mucosa showing  reactive changes consistent with reflux esophagitis.                          An Alcian blue/PAS special stain is negative for goblet cells.  Control                           stains appropriately.                          No intestinal metaplasia, dysplasia or malignancy identified.      Clinical History 01/28/2022    Final                    Value:Procedure: EGD WITH BIOPSY                           Surgeon: Olevia Perches, MD (Primary)                           Pre-op Diagnosis: FAMILY HISTORY ESOPHAGEAL CANCER                           Post-op Diagnosis: Antral Gastritis                          Esophagitis                               Gross Description 01/28/2022    Final                    Value:A: Gastric                          The specimen is received in formalin labeled with the patient's name and                           "gastric antrum biopsy" and consists of 1 soft pink fragments of tissue                           measuring 3 mm, A1 entire.                            B: Esophagus                          The specimen is received in formalin labeled with the patient's name and                           "esophageal biopsy x3" and consists of 3 soft white fragments of tissue                           ranging from 2 mm up to 4 mm in greatest dimension, B1 entire.       Assessment & Plan:       ICD-10-CM    1. Annual physical exam  Z00.00 Exam Performed      2. Gout, unspecified  M10.9 Labs ordered here and on this visit will be be addressed by me. I will contact patient and address therapy options once completed.     allopurinoL (ZYLOPRIM) 100 mg Oral Tablet      3. Anxiety disorder due to known physiological condition  F06.4 Labs ordered here and on this visit will be be addressed by me. I will contact patient and address therapy options once  completed.     ALPRAZolam (XANAX) 0.5 mg Oral Tablet     sertraline (ZOLOFT) 50 mg Oral Tablet      4. Migraine without aura, not intractable, without status migrainosus  G43.009 Clinically without symptoms or evidence of recurrent disease. Continue current treatment plan as stable.     butalbital-acetaminophen-caffeine (FIORICET) 50-325-40 mg Oral Tablet      5. Encounter for screening for lipoid disorders  Z13.220 ezetimibe (ZETIA) 10 mg Oral Tablet      6. Essential (primary) hypertension  I10 Blood Pressure today is stable and will continue current treatment plans     fenofibrate micronized (LOFIBRA) 134 mg Oral Capsule     hydroCHLOROthiazide (HYDRODIURIL) 25 mg Oral Tablet     metoprolol succinate (TOPROL-XL) 50 mg Oral Tablet Sustained Release 24 hr     CBC     URINALYSIS, MACROSCOPIC AND MICROSCOPIC W/CULTURE REFLEX     COMPREHENSIVE METABOLIC PANEL, NON-FASTING      7. Hyperlipidemia, unspecified  E78.5 Labs ordered here and on this visit will be be addressed by me. I will contact patient and address therapy options once completed.     rosuvastatin (CRESTOR) 20 mg Oral Tablet     LIPID PANEL      8. Insomnia, unspecified  G47.00     The patient's condition is unchanged from prior.  We have discussed whether there is need for any therapeutic changes and the patient states that they are tolerating current treatments and will just continue with current lifestyle. temazepam (RESTORIL) 15 mg Oral Capsule      9. Irritable bowel syndrome without diarrhea  K58.9 omeprazole (PRILOSEC) 40 mg Oral Capsule, Delayed Release(E.C.)      10. Allergic rhinitis, unspecified  J30.9 montelukast (SINGULAIR) 10 mg Oral Tablet      11. Elevated blood sugar  R73.9 Labs ordered here and on this visit will be be addressed by me. I will contact patient and address therapy options once completed.     HGA1C (HEMOGLOBIN A1C WITH EST AVG GLUCOSE)      12. Chronic fatigue  R53.82 Labs ordered here and on this visit will be be addressed  by me. I will contact patient and address therapy options once completed.     THYROID STIMULATING HORMONE WITH FREE T4 REFLEX      13. Vitamin B12 deficiency  E53.8 Labs ordered here and on this visit will be be addressed by me. I will contact patient and address therapy options once completed.     VITAMIN B12      14. Benign prostatic hyperplasia with nocturia  N40.1 Labs ordered here and on this visit will be be addressed by me. I will contact patient and address therapy options once completed.     PSA, DIAGNOSTIC    R35.1       15. Other dietary vitamin B12 deficiency anemia  D51.3 VITAMIN B12        Orders Placed This Encounter    Flu Vaccine,6 month-adult,0.5 mL IM (Admin)    CBC    LIPID PANEL  PSA, DIAGNOSTIC    VITAMIN B12    HGA1C (HEMOGLOBIN A1C WITH EST AVG GLUCOSE)    URINALYSIS, MACROSCOPIC AND MICROSCOPIC W/CULTURE REFLEX    COMPREHENSIVE METABOLIC PANEL, NON-FASTING    THYROID STIMULATING HORMONE WITH FREE T4 REFLEX    allopurinoL (ZYLOPRIM) 100 mg Oral Tablet    ALPRAZolam (XANAX) 0.5 mg Oral Tablet    butalbital-acetaminophen-caffeine (FIORICET) 50-325-40 mg Oral Tablet    ezetimibe (ZETIA) 10 mg Oral Tablet    fenofibrate micronized (LOFIBRA) 134 mg Oral Capsule    fluticasone propionate (FLONASE) 50 mcg/actuation Nasal Spray, Suspension    hydroCHLOROthiazide (HYDRODIURIL) 25 mg Oral Tablet    metoprolol succinate (TOPROL-XL) 50 mg Oral Tablet Sustained Release 24 hr    rosuvastatin (CRESTOR) 20 mg Oral Tablet    sertraline (ZOLOFT) 50 mg Oral Tablet    temazepam (RESTORIL) 15 mg Oral Capsule    omeprazole (PRILOSEC) 40 mg Oral Capsule, Delayed Release(E.C.)    montelukast (SINGULAIR) 10 mg Oral Tablet     Medications Discontinued During This Encounter   Medication Reason    pregabalin (LYRICA) 50 mg Oral Capsule Medication Reconciliation    fluticasone propionate (FLONASE) 50 mcg/actuation Nasal Spray, Suspension Reorder    butalbital-acetaminophen-caffeine (FIORICET) 50-325-40 mg Oral Tablet  Reorder    ALPRAZolam (XANAX) 0.5 mg Oral Tablet Reorder    temazepam (RESTORIL) 15 mg Oral Capsule Reorder    hydroCHLOROthiazide (HYDRODIURIL) 25 mg Oral Tablet Reorder    omeprazole (PRILOSEC) 40 mg Oral Capsule, Delayed Release(E.C.) Reorder    rosuvastatin (CRESTOR) 20 mg Oral Tablet Reorder    allopurinoL (ZYLOPRIM) 100 mg Oral Tablet Reorder    fenofibrate micronized (LOFIBRA) 134 mg Oral Capsule Reorder    metoprolol succinate (TOPROL-XL) 50 mg Oral Tablet Sustained Release 24 hr Reorder    montelukast (SINGULAIR) 10 mg Oral Tablet     ezetimibe (ZETIA) 10 mg Oral Tablet Reorder    sertraline (ZOLOFT) 50 mg Oral Tablet Reorder        Health Maintenance   Topic Date Due    Influenza Vaccine (1) Never done    Adult Tdap-Td (1 - Tdap) 01/09/2023 (Originally 07/17/1984)    Shingles Vaccine (1 of 2) 01/09/2023 (Originally 07/16/2013)    Depression Screening  01/06/2024    NonMedicare Preventative Exam  01/06/2024    Prostate Cancer Screening  01/12/2024    Colonoscopy  07/29/2029    Hepatitis B Vaccine  Discontinued    Hepatitis C screening  Discontinued    HIV Screening  Discontinued    Covid-19 Vaccine  Discontinued         Return in about 1 year (around 01/06/2024).    The patient has been educated and verbalized understanding regarding the services provided during this visit.    Lucia Gaskins, DO  01/06/2023 10:33

## 2023-01-07 ENCOUNTER — Encounter (INDEPENDENT_AMBULATORY_CARE_PROVIDER_SITE_OTHER): Payer: Self-pay | Admitting: Internal Medicine

## 2023-01-07 DIAGNOSIS — R351 Nocturia: Secondary | ICD-10-CM

## 2023-01-07 DIAGNOSIS — I10 Essential (primary) hypertension: Secondary | ICD-10-CM

## 2023-01-07 DIAGNOSIS — R739 Hyperglycemia, unspecified: Secondary | ICD-10-CM

## 2023-01-07 DIAGNOSIS — R5382 Chronic fatigue, unspecified: Secondary | ICD-10-CM

## 2023-01-07 DIAGNOSIS — E538 Deficiency of other specified B group vitamins: Secondary | ICD-10-CM

## 2023-01-07 DIAGNOSIS — D513 Other dietary vitamin B12 deficiency anemia: Secondary | ICD-10-CM

## 2023-01-07 DIAGNOSIS — E785 Hyperlipidemia, unspecified: Secondary | ICD-10-CM

## 2023-01-07 LAB — LIPID PANEL
CHOLESTEROL: 145
HDL-CHOLESTEROL: 48
LDL (CALCULATED): 71
TRIGLYCERIDES: 151
VLDL (CALCULATED): 26

## 2023-01-07 LAB — PSA, DIAGNOSTIC: PROSTATE SPECIFIC AG: 1.9

## 2023-01-07 LAB — HGA1C (HEMOGLOBIN A1C WITH EST AVG GLUCOSE): HEMOGLOBIN A1C: 5.7

## 2023-01-11 ENCOUNTER — Ambulatory Visit
Admission: RE | Admit: 2023-01-11 | Discharge: 2023-01-11 | Disposition: A | Discharge status: Home / Self Care | Payer: BC Managed Care – PPO | Source: Ambulatory Visit | Attending: Orthopaedic Surgery | Admitting: Orthopaedic Surgery

## 2023-01-11 ENCOUNTER — Other Ambulatory Visit: Payer: Self-pay

## 2023-01-11 DIAGNOSIS — M25562 Pain in left knee: Secondary | ICD-10-CM | POA: Insufficient documentation

## 2023-01-13 ENCOUNTER — Ambulatory Visit (INDEPENDENT_AMBULATORY_CARE_PROVIDER_SITE_OTHER): Payer: Self-pay | Admitting: Internal Medicine

## 2023-02-07 ENCOUNTER — Other Ambulatory Visit (INDEPENDENT_AMBULATORY_CARE_PROVIDER_SITE_OTHER): Payer: Self-pay | Admitting: Internal Medicine

## 2023-02-07 DIAGNOSIS — F064 Anxiety disorder due to known physiological condition: Secondary | ICD-10-CM

## 2023-02-07 DIAGNOSIS — M109 Gout, unspecified: Secondary | ICD-10-CM

## 2023-02-07 DIAGNOSIS — E785 Hyperlipidemia, unspecified: Secondary | ICD-10-CM

## 2023-02-07 DIAGNOSIS — Z13220 Encounter for screening for lipoid disorders: Secondary | ICD-10-CM

## 2023-02-07 DIAGNOSIS — I10 Essential (primary) hypertension: Secondary | ICD-10-CM

## 2023-02-07 DIAGNOSIS — K589 Irritable bowel syndrome without diarrhea: Secondary | ICD-10-CM

## 2023-02-07 DIAGNOSIS — J309 Allergic rhinitis, unspecified: Secondary | ICD-10-CM

## 2023-03-13 ENCOUNTER — Other Ambulatory Visit: Payer: Self-pay

## 2023-05-15 ENCOUNTER — Other Ambulatory Visit (INDEPENDENT_AMBULATORY_CARE_PROVIDER_SITE_OTHER): Payer: Self-pay | Admitting: Internal Medicine

## 2023-05-15 DIAGNOSIS — F064 Anxiety disorder due to known physiological condition: Secondary | ICD-10-CM

## 2023-05-15 DIAGNOSIS — G4700 Insomnia, unspecified: Secondary | ICD-10-CM

## 2023-06-16 ENCOUNTER — Encounter (INDEPENDENT_AMBULATORY_CARE_PROVIDER_SITE_OTHER): Payer: Self-pay | Admitting: Surgery

## 2023-06-24 ENCOUNTER — Other Ambulatory Visit (INDEPENDENT_AMBULATORY_CARE_PROVIDER_SITE_OTHER): Payer: Self-pay

## 2023-06-25 ENCOUNTER — Other Ambulatory Visit: Payer: Self-pay

## 2023-06-25 ENCOUNTER — Encounter (INDEPENDENT_AMBULATORY_CARE_PROVIDER_SITE_OTHER): Payer: Self-pay | Admitting: Surgery

## 2023-06-25 ENCOUNTER — Encounter (INDEPENDENT_AMBULATORY_CARE_PROVIDER_SITE_OTHER): Payer: Self-pay | Admitting: Internal Medicine

## 2023-06-25 ENCOUNTER — Ambulatory Visit (INDEPENDENT_AMBULATORY_CARE_PROVIDER_SITE_OTHER): Admitting: Surgery

## 2023-06-25 VITALS — BP 131/81 | HR 60 | Resp 18 | Ht 72.0 in | Wt 257.0 lb

## 2023-06-25 DIAGNOSIS — K2100 Gastro-esophageal reflux disease with esophagitis, without bleeding: Secondary | ICD-10-CM | POA: Insufficient documentation

## 2023-06-25 DIAGNOSIS — Z800 Family history of malignant neoplasm of digestive organs: Secondary | ICD-10-CM

## 2023-06-25 NOTE — Progress Notes (Signed)
 [facility]AL [facility]  [address]  [place] [address]    History and Physical    Name: Robert Medina MRN:  [id number]   Date: 06/25/2023 DOB:  11/07/1965 (58 y.o.)           Follow Up    Referring Provider: No ref. provider found     Chief Complaint:  Follow Up (6 month follow up 01/2022 EGD antral gastritis, possible barretts esophagitis)       HPI:  Patient has a history of reflux esophagitis in his last upper endoscopy was in November of 2023 when I suspected he might have Barrett's esophagitis however pathology report came back negative for that and he was just diagnosed with reflux esophagitis.  On last visit since he was completely asymptomatic and based on the finding I suggested that he can go off of Prilosec and we could consider something like Pepcid however after discussing with the PCP Dr. Felipe Horton he continued with the Prilosec particularly with a history of esophageal cancer in the family.    Currently he has absolutely no reflux symptoms no epigastric pain or heartburn or any other problems.    Past Medical History:  Past Medical History:   Diagnosis Date    Allergic rhinitis     Anxiety     B12 deficiency     Elevated blood sugar     Elevated PSA     Esophageal reflux     Gout, unspecified     Hypercholesterolemia     Hypertension     IBS (irritable bowel syndrome)     Insomnia     Migraine     Osteoarthritis     Sciatica of right side     Vitamin D deficiency          Past Surgical History:   Procedure Laterality Date    COLONOSCOPY      Polyp removal 2017    ESOPHAGOSCOPY / EGD  01/28/2022    Dr. Elna Haggis    SPINE SURGERY      [facility]         Family Medical History:       Problem Relation (Age of Onset)    Alzheimer's/Dementia Mother    Esophageal cancer Brother, Brother             Social History     Tobacco Use    Smoking status: Never    Smokeless tobacco: Never   Vaping Use    Vaping status: Never Used   Substance Use Topics    Alcohol use: Yes     Alcohol/week:  12.0 standard drinks of alcohol     Types: 12 Cans of beer per week    Drug use: Never        Allergies:  Allergies   Allergen Reactions    Shellfish Containing Products  Other Adverse Reaction (Add comment)     Other reaction(s): Other (See Comments)    Unsure of reaction; due to childhood allergies was advised to avoid shellfish        Medications:  Current Outpatient Medications   Medication Sig    allopurinoL (ZYLOPRIM) 100 mg Oral Tablet TAKE 1 TABLET by mouth ONCE DAILY    ALPRAZolam (XANAX) 0.5 mg Oral Tablet TAKE 1 TABLET by mouth EVERY 8 HOURS AS NEEDED for trouble sleeping    aspirin (ECOTRIN) 81 mg Oral Tablet, Delayed Release (E.C.) Take 1 Tablet (81 mg total) by mouth Once a day  butalbital-acetaminophen-caffeine (FIORICET) 50-325-40 mg Oral Tablet Take 1 Tablet by mouth Every 6 hours as needed for Headaches    celecoxib (CELEBREX) 200 mg Oral Capsule     cetirizine (ZYRTEC) 10 mg Oral Tablet Take 1 Tablet (10 mg total) by mouth Once a day    ezetimibe (ZETIA) 10 mg Oral Tablet TAKE 1 TABLET by mouth ONCE DAILY    fenofibrate micronized (LOFIBRA) 134 mg Oral Capsule TAKE 1 CAPSULE by mouth ONCE DAILY    fluticasone propionate (FLONASE) 50 mcg/actuation Nasal Spray, Suspension Administer 2 Sprays into each nostril Once a day    hydroCHLOROthiazide (HYDRODIURIL) 25 mg Oral Tablet TAKE 1 TABLET by mouth ONCE DAILY    methocarbamoL (ROBAXIN) 500 mg Oral Tablet Take 1 Tablet (500 mg total) by mouth Four times a day    metoprolol succinate (TOPROL-XL) 50 mg Oral Tablet Sustained Release 24 hr TAKE 1 TABLET by mouth ONCE DAILY    montelukast (SINGULAIR) 10 mg Oral Tablet TAKE 1 TABLET by mouth ONCE DAILY    multivitamin-iron-folic acid (CENTRUM) 18-400 mg-mcg Oral Tablet Take 1 Tablet by mouth Once a day    naphazoline-pheniramine (NAPHCON-A) 0.025-0.3 % Ophthalmic Drops Administer 1 Drop into affected eye(s)    omeprazole (PRILOSEC) 40 mg Oral Capsule, Delayed Release(E.C.) TAKE 1 CAPSULE by mouth ONCE  DAILY    pregabalin (LYRICA) 75 mg Oral Capsule Take 1 Capsule (75 mg total) by mouth Twice daily    rosuvastatin (CRESTOR) 20 mg Oral Tablet TAKE 1 TABLET by mouth ONCE DAILY    sertraline (ZOLOFT) 50 mg Oral Tablet TAKE 1 TABLET by mouth ONCE DAILY    temazepam (RESTORIL) 15 mg Oral Capsule TAKE 1 CAPSULE by mouth every night        Physical Exam:  Patient is fully awake and alert and fully oriented and in no distress.  Vital signs are stable.  Abdomen is soft and benign and there is absolutely no tenderness even on deep palpation in the epigastric area.  Lungs are clear.  Heart sounds normal.    Previous reports including operative report and biopsy reports reviewed.  Assessment and Plan:    ICD-10-CM    1. Gastroesophageal reflux disease with esophagitis without hemorrhage  K21.00         Patient will continue the present medications with the omeprazole.  Considering strong family history of esophageal cancer he may need a follow-up EGD towards the end of this year because his last EGD was in November of 2023 however this can be decided by the PCP.  Follow Up:  No follow-ups on file.    Elna Haggis, MD

## 2023-06-26 ENCOUNTER — Other Ambulatory Visit (INDEPENDENT_AMBULATORY_CARE_PROVIDER_SITE_OTHER): Payer: Self-pay | Admitting: Internal Medicine

## 2023-06-26 MED ORDER — ALBUTEROL SULFATE HFA 90 MCG/ACTUATION AEROSOL INHALER
2.0000 | INHALATION_SPRAY | Freq: Four times a day (QID) | RESPIRATORY_TRACT | 5 refills | Status: AC | PRN
Start: 2023-06-26 — End: ?

## 2023-08-11 ENCOUNTER — Other Ambulatory Visit (INDEPENDENT_AMBULATORY_CARE_PROVIDER_SITE_OTHER): Payer: Self-pay | Admitting: Internal Medicine

## 2023-08-11 DIAGNOSIS — E785 Hyperlipidemia, unspecified: Secondary | ICD-10-CM

## 2023-08-11 DIAGNOSIS — J309 Allergic rhinitis, unspecified: Secondary | ICD-10-CM

## 2023-08-11 DIAGNOSIS — K589 Irritable bowel syndrome without diarrhea: Secondary | ICD-10-CM

## 2023-08-11 DIAGNOSIS — M109 Gout, unspecified: Secondary | ICD-10-CM

## 2023-08-11 DIAGNOSIS — I10 Essential (primary) hypertension: Secondary | ICD-10-CM

## 2023-08-11 DIAGNOSIS — Z13220 Encounter for screening for lipoid disorders: Secondary | ICD-10-CM

## 2023-08-11 DIAGNOSIS — F064 Anxiety disorder due to known physiological condition: Secondary | ICD-10-CM

## 2023-09-16 ENCOUNTER — Other Ambulatory Visit (INDEPENDENT_AMBULATORY_CARE_PROVIDER_SITE_OTHER): Payer: Self-pay | Admitting: Internal Medicine

## 2023-09-16 DIAGNOSIS — G4700 Insomnia, unspecified: Secondary | ICD-10-CM

## 2023-09-16 DIAGNOSIS — F064 Anxiety disorder due to known physiological condition: Secondary | ICD-10-CM

## 2023-09-30 ENCOUNTER — Telehealth (INDEPENDENT_AMBULATORY_CARE_PROVIDER_SITE_OTHER): Payer: Self-pay | Admitting: Internal Medicine

## 2023-09-30 MED ORDER — AZELASTINE 137 MCG (0.1 %) NASAL SPRAY: 2.0000 | Freq: Two times a day (BID) | NASAL | 3 refills | Status: DC | PRN

## 2023-09-30 NOTE — Telephone Encounter (Signed)
 Wife (pharmacist) called stating pt is on Singulair  and zyrtec for his allergies. Having constant nasal drip. Would dr claudene consider sending him in a script for astelin  nose spray?

## 2023-09-30 NOTE — Telephone Encounter (Signed)
 This current medication has been ERX to their pharmacy.   Krystal DELENA Sharps, DO  09/30/2023 15:10

## 2024-01-08 ENCOUNTER — Other Ambulatory Visit: Payer: Self-pay

## 2024-01-08 ENCOUNTER — Ambulatory Visit: Payer: Self-pay | Attending: Internal Medicine | Admitting: Internal Medicine

## 2024-01-08 ENCOUNTER — Encounter (INDEPENDENT_AMBULATORY_CARE_PROVIDER_SITE_OTHER): Payer: Self-pay | Admitting: Internal Medicine

## 2024-01-08 VITALS — BP 132/75 | HR 60 | Ht 72.0 in | Wt 258.0 lb

## 2024-01-08 DIAGNOSIS — E785 Hyperlipidemia, unspecified: Secondary | ICD-10-CM | POA: Insufficient documentation

## 2024-01-08 DIAGNOSIS — Z0000 Encounter for general adult medical examination without abnormal findings: Secondary | ICD-10-CM | POA: Insufficient documentation

## 2024-01-08 DIAGNOSIS — R739 Hyperglycemia, unspecified: Secondary | ICD-10-CM | POA: Insufficient documentation

## 2024-01-08 DIAGNOSIS — R351 Nocturia: Secondary | ICD-10-CM | POA: Insufficient documentation

## 2024-01-08 DIAGNOSIS — M5116 Intervertebral disc disorders with radiculopathy, lumbar region: Secondary | ICD-10-CM | POA: Insufficient documentation

## 2024-01-08 DIAGNOSIS — D513 Other dietary vitamin B12 deficiency anemia: Secondary | ICD-10-CM | POA: Insufficient documentation

## 2024-01-08 DIAGNOSIS — E782 Mixed hyperlipidemia: Secondary | ICD-10-CM | POA: Insufficient documentation

## 2024-01-08 DIAGNOSIS — R5382 Chronic fatigue, unspecified: Secondary | ICD-10-CM | POA: Insufficient documentation

## 2024-01-08 DIAGNOSIS — E538 Deficiency of other specified B group vitamins: Secondary | ICD-10-CM | POA: Insufficient documentation

## 2024-01-08 DIAGNOSIS — G43009 Migraine without aura, not intractable, without status migrainosus: Secondary | ICD-10-CM | POA: Insufficient documentation

## 2024-01-08 DIAGNOSIS — I10 Essential (primary) hypertension: Secondary | ICD-10-CM | POA: Insufficient documentation

## 2024-01-08 DIAGNOSIS — F064 Anxiety disorder due to known physiological condition: Secondary | ICD-10-CM | POA: Insufficient documentation

## 2024-01-08 DIAGNOSIS — N401 Enlarged prostate with lower urinary tract symptoms: Secondary | ICD-10-CM | POA: Insufficient documentation

## 2024-01-08 NOTE — Progress Notes (Signed)
 INTERNAL MEDICINE, [facility]  [address]  [place] [address]  Operated by [facility]     Name: Robert Medina MRN:  [id number]   Date: 01/08/2024 Age: 58 y.o.       Chief Complaint:    Chief Complaint   Patient presents with    Annual Wellness Exam     Would like to be checked for sleep apnea. Has been going to [facility] for back injections.  Would like your option on what else he can do, cause the injection hurt really bad. Has to get these injection every 3 mths.         History of Present Illness  Robert Medina is a 58 year old male who presents with persistent neck and back pain despite recent treatments.    He experiences severe neck and back pain, described as 'unbelievable' and 'excruciating,' to the point of nearly passing out. Despite multiple treatments, including epidural injections and a recent right-sided lumbar ablation, he continues to have constant burning pain in his lower back and buttocks.    Previous treatments, such as epidural and steroid injections in the SI joints, provide only temporary relief lasting two to three months. He notes the significant cost of these treatments, with insurance covering them only twice a year, which is not sustainable for him.    Additional symptoms include numbness in his leg and hip pain, sometimes addressed with epidural injections. He anticipates further injections in the mid to upper back area but is concerned about the cycle of treatments and their limited duration of effectiveness.    He has applied for disability due to his inability to sit or stand for prolonged periods, impacting his ability to work. Previously, he worked from home using a laptop, but his current condition has made even that difficult.    His wife has suggested he might have sleep apnea, and he acknowledges being overweight, which could contribute to this condition.    No shortness of breath, chest pain, chest pressure,  nausea, or swelling.       Past Medical History:  Past Medical History:   Diagnosis Date    Allergic rhinitis     Anxiety     B12 deficiency     Elevated blood sugar     Elevated PSA     Esophageal reflux     Gout, unspecified     Hypercholesterolemia     Hypertension     IBS (irritable bowel syndrome)     Insomnia     Migraine     Osteoarthritis     Sciatica of right side     Vitamin D deficiency          Past Surgical History:   Procedure Laterality Date    COLONOSCOPY      Polyp removal 2017    ESOPHAGOSCOPY / EGD  01/28/2022    Dr. Cliffton Galloway    SPINE SURGERY      [facility]      Current Outpatient Medications   Medication Sig    albuterol  sulfate (PROVENTIL  OR VENTOLIN  OR PROAIR ) 90 mcg/actuation Inhalation oral inhaler Take 2 Puffs by inhalation Four times a day as needed    allopurinoL  (ZYLOPRIM ) 100 mg Oral Tablet TAKE 1 TABLET by mouth DAILY    ALPRAZolam  (XANAX ) 0.5 mg Oral Tablet TAKE 1 TABLET by mouth EVERY 8 HOURS AS NEEDED for trouble sleeping    aspirin (ECOTRIN) 81 mg  Oral Tablet, Delayed Release (E.C.) Take 1 Tablet (81 mg total) by mouth Once a day    azelastine  (ASTELIN ) 137 mcg (0.1 %) Nasal Spray, Non-Aerosol Administer 2 Sprays into each nostril Twice per day as needed for Rhinitis Use in each nostril as directed    butalbital -acetaminophen -caffeine  (FIORICET ) 50-325-40 mg Oral Tablet Take 1 Tablet by mouth Every 6 hours as needed for Headaches    cetirizine (ZYRTEC) 10 mg Oral Tablet Take 1 Tablet (10 mg total) by mouth Once a day    ezetimibe  (ZETIA ) 10 mg Oral Tablet TAKE 1 TABLET by mouth DAILY    fenofibrate  micronized (LOFIBRA) 134 mg Oral Capsule TAKE 1 CAPSULE by mouth DAILY    fluticasone  propionate (FLONASE ) 50 mcg/actuation Nasal Spray, Suspension Administer 2 Sprays into each nostril Once a day    hydroCHLOROthiazide  (HYDRODIURIL ) 25 mg Oral Tablet TAKE 1 TABLET by mouth DAILY    methocarbamoL (ROBAXIN) 500 mg Oral Tablet Take 1 Tablet (500 mg total) by mouth Four times a day     metoprolol  succinate (TOPROL -XL) 50 mg Oral Tablet Sustained Release 24 hr TAKE 1 TABLET by mouth DAILY    montelukast  (SINGULAIR ) 10 mg Oral Tablet TAKE 1 TABLET by mouth DAILY    multivitamin-iron-folic acid (CENTRUM) 18-400 mg-mcg Oral Tablet Take 1 Tablet by mouth Once a day    omeprazole  (PRILOSEC) 40 mg Oral Capsule, Delayed Release(E.C.) TAKE 1 CAPSULE by mouth DAILY    pregabalin (LYRICA) 75 mg Oral Capsule Take 1 Capsule (75 mg total) by mouth Twice daily    rosuvastatin  (CRESTOR ) 20 mg Oral Tablet TAKE 1 TABLET by mouth DAILY    sertraline  (ZOLOFT ) 50 mg Oral Tablet TAKE 1 TABLET by mouth DAILY    temazepam  (RESTORIL ) 15 mg Oral Capsule TAKE 1 CAPSULE by mouth every night     Allergies[1]    Family History:  Family Medical History:       Problem Relation (Age of Onset)    Alzheimer's/Dementia Mother    Esophageal cancer Brother, Brother              Social History:   Tobacco Use History[2]  Social History     Substance and Sexual Activity   Alcohol Use Yes    Alcohol/week: 12.0 standard drinks of alcohol    Types: 12 Cans of beer per week     Social History     Occupational History    Not on file       Review of Systems:  Review of systems as discussed in HPI    Problem List:  Problem List[3]    Physical Examination:  BP 132/75 (Site: Left Arm, Patient Position: Sitting, Cuff Size: Adult Large)   Pulse 60   Ht 1.829 m (6')   Wt 117 kg (258 lb)   SpO2 94%   BMI 34.99 kg/m       Physical Exam  CONSTITUTIONAL: General- He is not in acute distress.  Appearance- Normal appearance.  HENT: Head- Normocephalic.  Right Ear- Tympanic membrane normal.  Left Ear- Tympanic membrane normal.  Nose- Nose normal.  Mouth-Throat-  Mouth-Mucous membranes are moist.  EYES: General- No scleral icterus. Extraocular Movements- Extraocular movements intact. Conjunctiva-sclera-Conjunctivae normal.  Pupils- Pupils are equal, round, and reactive to light.  NECK: Vascular-No carotid bruit.  CARDIOVASCULAR: Rate and Rhythm-  Normal rate and regular rhythm.  Pulses- Normal pulses.   Heart sounds- Normal heart sounds.  PULMONARY: Effort- Pulmonary effort is normal.   Breath sounds- Normal  breath sounds. No wheezing, rhonchi or rales.  ABDOMINAL: General-Abdomen is flat. Bowel sounds are normal.   Tenderness-There is no abdominal tenderness. There is no guarding.  MUSCULOSKELETAL: General- No swelling.  Normal range of motion.  Cervical back-Normal range of motion.  No tenderness.  Right lower leg-No edema.   Left lower leg-No edema.  SKIN: General- Skin is warm.   Capillary Refill- Capillary refill takes less than 2 seconds.  NEUROLOGICAL: General- No focal deficit present.   Mental Status- He is alert and oriented to person, place, and time.   Cranial Nerves- No cranial nerve deficit.    Health Maintenance:  Health Maintenance   Topic Date Due    NonMedicare Preventative Exam  01/06/2024    Influenza Vaccine (1) 02/07/2024 (Originally 11/10/2023)    DTaP-Tdap-Td Series (1 - Tdap) 01/07/2025 (Originally 07/17/1982)    Shingles Vaccine (1 of 2) 01/07/2025 (Originally 07/16/2013)    Pneumococcal Vaccination, Age 68+ (1 of 1 - PCV) 01/07/2025 (Originally 07/16/2013)    Prostate Cancer Screening  01/05/2025    Depression Screening  01/07/2025    Colonoscopy  07/29/2029    Hepatitis C screening  Discontinued    HIV Screening  Discontinued    Covid-19 Vaccine (Shared decision making)  Discontinued        Results         Computed FIB-4 Calculation unavailable. One or more values for this score either were not found within the given timeframe or did not fit some other criterion.    Assessment:      ICD-10-CM    1. Annual physical exam  Z00.00       2. Essential (primary) hypertension  I10 CBC     URINALYSIS, MACROSCOPIC AND MICROSCOPIC W/CULTURE REFLEX     COMPREHENSIVE METABOLIC PANEL, NON-FASTING      3. Hyperlipidemia, unspecified hyperlipidemia type  E78.5 LIPID PANEL      4. Anxiety disorder due to known physiological condition  F06.4       5.  Lumbar disc herniation with radiculopathy  M51.16       6. Migraine without aura, not intractable, without status migrainosus  G43.009       7. Mixed hyperlipidemia  E78.2 LIPID PANEL      8. Chronic fatigue  R53.82 THYROID  STIMULATING HORMONE WITH FREE T4 REFLEX      9. Elevated blood sugar  R73.9 HGA1C (HEMOGLOBIN A1C WITH EST AVG GLUCOSE)      10. Vitamin B12 deficiency  E53.8 VITAMIN B12      11. Benign prostatic hyperplasia with nocturia  N40.1 PSA, DIAGNOSTIC    R35.1       12. Other dietary vitamin B12 deficiency anemia  D51.3 VITAMIN B12         Orders Placed This Encounter    CBC    LIPID PANEL    PSA, DIAGNOSTIC    VITAMIN B12    HGA1C (HEMOGLOBIN A1C WITH EST AVG GLUCOSE)    URINALYSIS, MACROSCOPIC AND MICROSCOPIC W/CULTURE REFLEX    COMPREHENSIVE METABOLIC PANEL, NON-FASTING    THYROID  STIMULATING HORMONE WITH FREE T4 REFLEX     Medications Discontinued During This Encounter   Medication Reason    naphazoline-pheniramine (NAPHCON-A) 0.025-0.3 % Ophthalmic Drops End of Procedure        Depression screening is negative. PHQ 2 Total: 0     Assessment & Plan  Adult Wellness Visit  Blood work results pending.    Lumbar radiculopathy with chronic pain  Chronic lumbar  radiculopathy with severe, constant burning pain in the lumbar region. Previous treatments including epidural injections and ablations provide only temporary relief. Pain is excruciating and impacts daily activities, including work. Financial burden of treatments is significant. Considering disability due to inability to work.  - Consult with [facility] regarding long-term management options and cost concerns.  - Consider disability application due to inability to work.    Suspected sleep apnea  Suspected sleep apnea, potentially related to obesity. Wife reports symptoms suggestive of sleep apnea. Previous testing by wife's primary care provider indicated mild sleep apnea.  - Ordered sleep apnea test through [facility].  - Will consider referral to sleep  specialist if test confirms sleep apnea.     Follow up:  Return in about 6 months (around 07/08/2024).    This note was partially created using voice recognition software and is inherently subject to errors including those of syntax and sound alike  substitutions which may escape proof reading.  In such instances, original meaning may be extrapolated by contextual derivation.    This note was created with assistance from [employer] via capture of conversational audio. Consent was obtained from the patient and all parties present prior to recording.      Krystal DELENA Sharps, DO  01/08/2024, 09:38         [1]   Allergies  Allergen Reactions    Shellfish Containing Products  Other Adverse Reaction (Add comment)     Other reaction(s): Other (See Comments)    Unsure of reaction; due to childhood allergies was advised to avoid shellfish   [2]   Social History  Tobacco Use   Smoking Status Never   Smokeless Tobacco Never   [3]   Patient Active Problem List  Diagnosis    Elevated PSA    Elevated blood sugar    B12 deficiency    Vitamin D deficiency    Esophageal reflux    Thoracic spine pain    Spondylolisthesis of lumbar region    S/P lumbar fusion    Other insomnia    Lumbar disc herniation with radiculopathy    Lumbar degenerative disc disease    Irritable bowel syndrome without diarrhea    Gout    Facet arthritis of lumbar region    Encounter for imaging of lower extremity to assess for fracture alignment or change    Degenerative spondylolisthesis    Anxiety    Allergic rhinitis    Acute right-sided low back pain with right-sided sciatica    Family history of esophageal cancer    Annual physical exam    Gout, unspecified    Anxiety disorder due to known physiological condition    Migraine without aura, not intractable, without status migrainosus    Encounter for screening for lipoid disorders    Essential (primary) hypertension    Hyperlipidemia, unspecified    Insomnia, unspecified    Allergic rhinitis, unspecified     Gastroesophageal reflux disease with esophagitis without hemorrhage

## 2024-01-09 ENCOUNTER — Encounter (INDEPENDENT_AMBULATORY_CARE_PROVIDER_SITE_OTHER): Payer: Self-pay | Admitting: Internal Medicine

## 2024-01-09 DIAGNOSIS — E785 Hyperlipidemia, unspecified: Secondary | ICD-10-CM

## 2024-01-09 DIAGNOSIS — G4700 Insomnia, unspecified: Secondary | ICD-10-CM

## 2024-01-09 DIAGNOSIS — E782 Mixed hyperlipidemia: Secondary | ICD-10-CM

## 2024-01-09 DIAGNOSIS — I10 Essential (primary) hypertension: Secondary | ICD-10-CM

## 2024-01-09 DIAGNOSIS — N401 Enlarged prostate with lower urinary tract symptoms: Secondary | ICD-10-CM

## 2024-01-09 DIAGNOSIS — D513 Other dietary vitamin B12 deficiency anemia: Secondary | ICD-10-CM

## 2024-01-09 DIAGNOSIS — R739 Hyperglycemia, unspecified: Secondary | ICD-10-CM

## 2024-01-09 DIAGNOSIS — R5382 Chronic fatigue, unspecified: Secondary | ICD-10-CM

## 2024-01-09 DIAGNOSIS — E538 Deficiency of other specified B group vitamins: Secondary | ICD-10-CM

## 2024-01-09 DIAGNOSIS — F064 Anxiety disorder due to known physiological condition: Secondary | ICD-10-CM

## 2024-01-09 LAB — HGA1C (HEMOGLOBIN A1C WITH EST AVG GLUCOSE): HEMOGLOBIN A1C: 5.6

## 2024-01-09 LAB — COMPREHENSIVE METABOLIC PANEL, NON-FASTING
CREATININE: 1.03
ESTIMATED GLOMERULAR FILTRATION RATE: 83

## 2024-01-09 LAB — LIPID PANEL
CHOLESTEROL: 144
HDL-CHOLESTEROL: 49
LDL (CALCULATED): 52
TRIGLYCERIDES: 276
VLDL (CALCULATED): 43

## 2024-01-09 LAB — PSA, DIAGNOSTIC: PROSTATE SPECIFIC AG: 1.4

## 2024-01-12 ENCOUNTER — Other Ambulatory Visit (INDEPENDENT_AMBULATORY_CARE_PROVIDER_SITE_OTHER): Payer: Self-pay | Admitting: Internal Medicine

## 2024-01-12 ENCOUNTER — Encounter (INDEPENDENT_AMBULATORY_CARE_PROVIDER_SITE_OTHER): Payer: Self-pay

## 2024-01-12 DIAGNOSIS — E785 Hyperlipidemia, unspecified: Secondary | ICD-10-CM

## 2024-01-12 DIAGNOSIS — R7989 Other specified abnormal findings of blood chemistry: Secondary | ICD-10-CM

## 2024-02-02 ENCOUNTER — Other Ambulatory Visit (INDEPENDENT_AMBULATORY_CARE_PROVIDER_SITE_OTHER): Payer: Self-pay | Admitting: Internal Medicine

## 2024-02-02 DIAGNOSIS — G43009 Migraine without aura, not intractable, without status migrainosus: Secondary | ICD-10-CM

## 2024-02-09 ENCOUNTER — Other Ambulatory Visit (INDEPENDENT_AMBULATORY_CARE_PROVIDER_SITE_OTHER): Payer: Self-pay | Admitting: Internal Medicine

## 2024-02-11 ENCOUNTER — Other Ambulatory Visit (INDEPENDENT_AMBULATORY_CARE_PROVIDER_SITE_OTHER): Payer: Self-pay | Admitting: Internal Medicine

## 2024-02-11 DIAGNOSIS — I10 Essential (primary) hypertension: Secondary | ICD-10-CM

## 2024-02-11 DIAGNOSIS — Z13220 Encounter for screening for lipoid disorders: Secondary | ICD-10-CM

## 2024-02-11 DIAGNOSIS — F064 Anxiety disorder due to known physiological condition: Secondary | ICD-10-CM

## 2024-02-11 DIAGNOSIS — J309 Allergic rhinitis, unspecified: Secondary | ICD-10-CM

## 2024-02-11 DIAGNOSIS — M109 Gout, unspecified: Secondary | ICD-10-CM

## 2024-02-11 DIAGNOSIS — K589 Irritable bowel syndrome without diarrhea: Secondary | ICD-10-CM

## 2024-02-11 DIAGNOSIS — E785 Hyperlipidemia, unspecified: Secondary | ICD-10-CM

## 2024-02-12 ENCOUNTER — Encounter (INDEPENDENT_AMBULATORY_CARE_PROVIDER_SITE_OTHER): Payer: Self-pay | Admitting: Internal Medicine

## 2024-02-13 ENCOUNTER — Encounter (INDEPENDENT_AMBULATORY_CARE_PROVIDER_SITE_OTHER): Payer: Self-pay | Admitting: Internal Medicine

## 2024-02-13 ENCOUNTER — Ambulatory Visit (HOSPITAL_COMMUNITY): Payer: Self-pay | Admitting: Internal Medicine

## 2024-02-13 DIAGNOSIS — R7989 Other specified abnormal findings of blood chemistry: Secondary | ICD-10-CM

## 2024-02-13 DIAGNOSIS — E785 Hyperlipidemia, unspecified: Secondary | ICD-10-CM

## 2024-02-13 LAB — LIPID PANEL
CHOLESTEROL: 147
HDL-CHOLESTEROL: 52
LDL (CALCULATED): 70
TRIGLYCERIDES: 145
VLDL (CALCULATED): 25

## 2024-02-18 ENCOUNTER — Other Ambulatory Visit (INDEPENDENT_AMBULATORY_CARE_PROVIDER_SITE_OTHER): Payer: Self-pay | Admitting: Internal Medicine

## 2024-02-18 DIAGNOSIS — E039 Hypothyroidism, unspecified: Secondary | ICD-10-CM

## 2024-02-18 MED ORDER — LEVOTHYROXINE 25 MCG TABLET: 25.0000 ug | ORAL_TABLET | Freq: Every morning | ORAL | 0 refills | Status: AC

## 2024-03-10 ENCOUNTER — Other Ambulatory Visit (INDEPENDENT_AMBULATORY_CARE_PROVIDER_SITE_OTHER): Payer: Self-pay | Admitting: Internal Medicine

## 2024-03-17 ENCOUNTER — Encounter (INDEPENDENT_AMBULATORY_CARE_PROVIDER_SITE_OTHER): Payer: Self-pay | Admitting: Internal Medicine

## 2024-03-17 NOTE — Telephone Encounter (Signed)
 This has already placed in his chart and so we will be documented however I think that he needs to be seen by different pain specialist.  Dr. Shirlee is a fantastic option but it may take several months in order to get in to see him.  As far as his anxiety is concerned and depression he may want to consider starting Pristiq  50 mg daily.  This has a potential to help not only with this pain but also we could help with his mood.  It is okay to set him up with Dr.Colliver

## 2024-03-18 ENCOUNTER — Other Ambulatory Visit (INDEPENDENT_AMBULATORY_CARE_PROVIDER_SITE_OTHER): Payer: Self-pay | Admitting: Internal Medicine

## 2024-03-18 DIAGNOSIS — M51369 Other intervertebral disc degeneration, lumbar region without mention of lumbar back pain or lower extremity pain: Secondary | ICD-10-CM

## 2024-03-18 DIAGNOSIS — M4310 Spondylolisthesis, site unspecified: Secondary | ICD-10-CM

## 2024-03-18 DIAGNOSIS — M47816 Spondylosis without myelopathy or radiculopathy, lumbar region: Secondary | ICD-10-CM

## 2024-03-18 DIAGNOSIS — M546 Pain in thoracic spine: Secondary | ICD-10-CM

## 2024-03-18 DIAGNOSIS — E039 Hypothyroidism, unspecified: Secondary | ICD-10-CM

## 2024-03-18 DIAGNOSIS — M4316 Spondylolisthesis, lumbar region: Secondary | ICD-10-CM

## 2024-03-18 MED ORDER — DESVENLAFAXINE SUCCINATE ER 50 MG TABLET,EXTENDED RELEASE 24 HR: 50.0000 mg | ORAL_TABLET | Freq: Every day | ORAL | 0 refills | Status: AC

## 2024-03-18 NOTE — Telephone Encounter (Signed)
 Continue with Zoloft  50 mg and we will change meds depending on how he responds to the Pristiq 

## 2024-06-16 ENCOUNTER — Ambulatory Visit (INDEPENDENT_AMBULATORY_CARE_PROVIDER_SITE_OTHER)

## 2024-06-23 ENCOUNTER — Ambulatory Visit (INDEPENDENT_AMBULATORY_CARE_PROVIDER_SITE_OTHER): Payer: Self-pay | Admitting: Internal Medicine

## 2024-06-28 ENCOUNTER — Ambulatory Visit (INDEPENDENT_AMBULATORY_CARE_PROVIDER_SITE_OTHER)

## 2024-07-05 ENCOUNTER — Ambulatory Visit (INDEPENDENT_AMBULATORY_CARE_PROVIDER_SITE_OTHER): Payer: Self-pay

## 2025-01-11 ENCOUNTER — Ambulatory Visit (INDEPENDENT_AMBULATORY_CARE_PROVIDER_SITE_OTHER): Payer: Self-pay | Admitting: Internal Medicine
